# Patient Record
Sex: Female | Born: 1985 | Race: White | Hispanic: No | Marital: Single | Smoking: Never smoker
Health system: Southern US, Community
[De-identification: ages and names within clinical notes are randomized; demographics above are authoritative.]

## PROBLEM LIST (undated history)

## (undated) DIAGNOSIS — K219 Gastro-esophageal reflux disease without esophagitis: Secondary | ICD-10-CM

## (undated) DIAGNOSIS — M0800 Unspecified juvenile rheumatoid arthritis of unspecified site: Secondary | ICD-10-CM

## (undated) DIAGNOSIS — R61 Generalized hyperhidrosis: Secondary | ICD-10-CM

## (undated) DIAGNOSIS — D649 Anemia, unspecified: Secondary | ICD-10-CM

## (undated) DIAGNOSIS — M419 Scoliosis, unspecified: Secondary | ICD-10-CM

## (undated) DIAGNOSIS — R51 Headache: Secondary | ICD-10-CM

## (undated) DIAGNOSIS — L905 Scar conditions and fibrosis of skin: Secondary | ICD-10-CM

## (undated) DIAGNOSIS — K589 Irritable bowel syndrome without diarrhea: Secondary | ICD-10-CM

## (undated) DIAGNOSIS — M1990 Unspecified osteoarthritis, unspecified site: Secondary | ICD-10-CM

## (undated) DIAGNOSIS — F329 Major depressive disorder, single episode, unspecified: Secondary | ICD-10-CM

## (undated) DIAGNOSIS — F32A Depression, unspecified: Secondary | ICD-10-CM

## (undated) HISTORY — PX: OTHER SURGICAL HISTORY: SHX169

## (undated) HISTORY — PX: TOTAL HIP ARTHROPLASTY: SHX124

---
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
## 2006-05-20 ENCOUNTER — Ambulatory Visit: Payer: Self-pay | Admitting: Family Medicine

## 2006-11-23 ENCOUNTER — Encounter: Admission: RE | Admit: 2006-11-23 | Discharge: 2006-11-23 | Payer: Self-pay | Admitting: Sports Medicine

## 2006-11-23 ENCOUNTER — Ambulatory Visit: Payer: Self-pay | Admitting: Family Medicine

## 2006-11-24 ENCOUNTER — Ambulatory Visit: Payer: Self-pay | Admitting: Family Medicine

## 2006-11-28 ENCOUNTER — Ambulatory Visit: Payer: Self-pay | Admitting: Sports Medicine

## 2006-11-29 ENCOUNTER — Ambulatory Visit: Payer: Self-pay | Admitting: Family Medicine

## 2006-12-22 DIAGNOSIS — L708 Other acne: Secondary | ICD-10-CM

## 2007-01-13 ENCOUNTER — Telehealth (INDEPENDENT_AMBULATORY_CARE_PROVIDER_SITE_OTHER): Payer: Self-pay | Admitting: *Deleted

## 2007-01-25 ENCOUNTER — Ambulatory Visit: Payer: Self-pay | Admitting: Family Medicine

## 2007-01-26 ENCOUNTER — Telehealth (INDEPENDENT_AMBULATORY_CARE_PROVIDER_SITE_OTHER): Payer: Self-pay | Admitting: *Deleted

## 2007-01-26 ENCOUNTER — Telehealth (INDEPENDENT_AMBULATORY_CARE_PROVIDER_SITE_OTHER): Payer: Self-pay | Admitting: Family Medicine

## 2007-01-31 ENCOUNTER — Ambulatory Visit: Payer: Self-pay | Admitting: Family Medicine

## 2007-02-28 ENCOUNTER — Telehealth (INDEPENDENT_AMBULATORY_CARE_PROVIDER_SITE_OTHER): Payer: Self-pay | Admitting: *Deleted

## 2007-03-06 ENCOUNTER — Telehealth (INDEPENDENT_AMBULATORY_CARE_PROVIDER_SITE_OTHER): Payer: Self-pay | Admitting: Family Medicine

## 2008-01-03 ENCOUNTER — Encounter (INDEPENDENT_AMBULATORY_CARE_PROVIDER_SITE_OTHER): Payer: Self-pay | Admitting: Family Medicine

## 2008-01-31 ENCOUNTER — Ambulatory Visit: Payer: Self-pay | Admitting: Family Medicine

## 2008-01-31 DIAGNOSIS — F329 Major depressive disorder, single episode, unspecified: Secondary | ICD-10-CM

## 2008-05-17 ENCOUNTER — Telehealth: Payer: Self-pay | Admitting: *Deleted

## 2008-05-20 ENCOUNTER — Ambulatory Visit: Payer: Self-pay | Admitting: Family Medicine

## 2008-06-11 ENCOUNTER — Ambulatory Visit: Payer: Self-pay | Admitting: Family Medicine

## 2008-06-11 LAB — CONVERTED CEMR LAB: Beta hcg, urine, semiquantitative: NEGATIVE

## 2008-06-12 ENCOUNTER — Ambulatory Visit: Payer: Self-pay | Admitting: Family Medicine

## 2008-06-17 ENCOUNTER — Telehealth: Payer: Self-pay | Admitting: *Deleted

## 2008-06-18 ENCOUNTER — Ambulatory Visit: Payer: Self-pay | Admitting: Family Medicine

## 2008-07-08 ENCOUNTER — Telehealth: Payer: Self-pay | Admitting: *Deleted

## 2008-07-30 ENCOUNTER — Encounter (INDEPENDENT_AMBULATORY_CARE_PROVIDER_SITE_OTHER): Payer: Self-pay | Admitting: Family Medicine

## 2008-07-31 ENCOUNTER — Encounter (INDEPENDENT_AMBULATORY_CARE_PROVIDER_SITE_OTHER): Payer: Self-pay | Admitting: Family Medicine

## 2008-09-10 ENCOUNTER — Ambulatory Visit: Payer: Self-pay | Admitting: Family Medicine

## 2008-12-09 ENCOUNTER — Ambulatory Visit: Payer: Self-pay | Admitting: Family Medicine

## 2008-12-09 ENCOUNTER — Telehealth (INDEPENDENT_AMBULATORY_CARE_PROVIDER_SITE_OTHER): Payer: Self-pay | Admitting: Family Medicine

## 2008-12-10 ENCOUNTER — Telehealth: Payer: Self-pay | Admitting: Family Medicine

## 2009-02-03 ENCOUNTER — Telehealth (INDEPENDENT_AMBULATORY_CARE_PROVIDER_SITE_OTHER): Payer: Self-pay | Admitting: *Deleted

## 2009-02-04 ENCOUNTER — Encounter: Payer: Self-pay | Admitting: Family Medicine

## 2009-02-04 ENCOUNTER — Ambulatory Visit: Payer: Self-pay | Admitting: Family Medicine

## 2009-02-05 LAB — CONVERTED CEMR LAB: CRP, High Sensitivity: 10.4 — ABNORMAL HIGH

## 2009-02-24 ENCOUNTER — Ambulatory Visit: Payer: Self-pay | Admitting: Family Medicine

## 2009-02-24 DIAGNOSIS — M069 Rheumatoid arthritis, unspecified: Secondary | ICD-10-CM | POA: Insufficient documentation

## 2009-03-03 ENCOUNTER — Telehealth (INDEPENDENT_AMBULATORY_CARE_PROVIDER_SITE_OTHER): Payer: Self-pay | Admitting: Family Medicine

## 2009-04-01 ENCOUNTER — Telehealth: Payer: Self-pay | Admitting: *Deleted

## 2009-04-22 ENCOUNTER — Encounter (INDEPENDENT_AMBULATORY_CARE_PROVIDER_SITE_OTHER): Payer: Self-pay | Admitting: Family Medicine

## 2009-04-24 ENCOUNTER — Telehealth: Payer: Self-pay | Admitting: Family Medicine

## 2009-05-08 ENCOUNTER — Telehealth: Payer: Self-pay | Admitting: *Deleted

## 2009-05-08 ENCOUNTER — Ambulatory Visit: Payer: Self-pay | Admitting: Family Medicine

## 2009-05-08 LAB — CONVERTED CEMR LAB
Glucose, Urine, Semiquant: NEGATIVE
Protein, U semiquant: 100
RBC / HPF: >20
Specific Gravity, Urine: >=1.03
Urobilinogen, UA: 1
pH: 6

## 2009-05-12 ENCOUNTER — Telehealth: Payer: Self-pay | Admitting: Family Medicine

## 2009-05-12 ENCOUNTER — Emergency Department (HOSPITAL_COMMUNITY): Admission: EM | Admit: 2009-05-12 | Discharge: 2009-05-13 | Payer: Self-pay | Admitting: Emergency Medicine

## 2009-05-12 ENCOUNTER — Ambulatory Visit: Payer: Self-pay | Admitting: Family Medicine

## 2009-05-13 ENCOUNTER — Ambulatory Visit (HOSPITAL_COMMUNITY): Admission: RE | Admit: 2009-05-13 | Discharge: 2009-05-13 | Payer: Self-pay | Admitting: Emergency Medicine

## 2009-05-15 ENCOUNTER — Telehealth: Payer: Self-pay | Admitting: Family Medicine

## 2009-05-16 ENCOUNTER — Emergency Department (HOSPITAL_COMMUNITY): Admission: EM | Admit: 2009-05-16 | Discharge: 2009-05-16 | Payer: Self-pay | Admitting: Emergency Medicine

## 2009-05-17 ENCOUNTER — Emergency Department (HOSPITAL_COMMUNITY): Admission: EM | Admit: 2009-05-17 | Discharge: 2009-05-17 | Payer: Self-pay | Admitting: Emergency Medicine

## 2009-05-20 ENCOUNTER — Telehealth: Payer: Self-pay | Admitting: Family Medicine

## 2009-06-10 ENCOUNTER — Encounter: Payer: Self-pay | Admitting: Family Medicine

## 2009-06-11 ENCOUNTER — Encounter: Payer: Self-pay | Admitting: Family Medicine

## 2009-06-22 ENCOUNTER — Emergency Department (HOSPITAL_COMMUNITY): Admission: EM | Admit: 2009-06-22 | Discharge: 2009-06-22 | Payer: Self-pay | Admitting: Emergency Medicine

## 2009-10-03 ENCOUNTER — Emergency Department (HOSPITAL_COMMUNITY): Admission: EM | Admit: 2009-10-03 | Discharge: 2009-10-03 | Payer: Self-pay | Admitting: Emergency Medicine

## 2009-10-13 ENCOUNTER — Observation Stay (HOSPITAL_COMMUNITY): Admission: EM | Admit: 2009-10-13 | Discharge: 2009-10-13 | Payer: Self-pay | Admitting: Emergency Medicine

## 2009-10-25 ENCOUNTER — Observation Stay (HOSPITAL_COMMUNITY): Admission: EM | Admit: 2009-10-25 | Discharge: 2009-10-25 | Payer: Self-pay | Admitting: Emergency Medicine

## 2009-12-03 ENCOUNTER — Observation Stay (HOSPITAL_COMMUNITY): Admission: EM | Admit: 2009-12-03 | Discharge: 2009-12-03 | Payer: Self-pay | Admitting: Emergency Medicine

## 2009-12-10 ENCOUNTER — Emergency Department (HOSPITAL_COMMUNITY): Admission: EM | Admit: 2009-12-10 | Discharge: 2009-12-11 | Payer: Self-pay | Admitting: Emergency Medicine

## 2009-12-15 ENCOUNTER — Encounter (INDEPENDENT_AMBULATORY_CARE_PROVIDER_SITE_OTHER): Payer: Self-pay | Admitting: *Deleted

## 2009-12-23 ENCOUNTER — Inpatient Hospital Stay (HOSPITAL_COMMUNITY): Admission: EM | Admit: 2009-12-23 | Discharge: 2009-12-26 | Payer: Self-pay | Admitting: Emergency Medicine

## 2010-01-21 ENCOUNTER — Encounter: Payer: Self-pay | Admitting: Gastroenterology

## 2010-01-22 ENCOUNTER — Ambulatory Visit: Payer: Self-pay | Admitting: Gastroenterology

## 2010-01-22 DIAGNOSIS — K589 Irritable bowel syndrome without diarrhea: Secondary | ICD-10-CM | POA: Insufficient documentation

## 2010-01-22 LAB — CONVERTED CEMR LAB
Alkaline Phosphatase: 68 units/L (ref 39–117)
BUN: 6 mg/dL (ref 6–23)
Basophils Relative: 0.2 % (ref 0.0–3.0)
Calcium: 9 mg/dL (ref 8.4–10.5)
Chloride: 103 meq/L (ref 96–112)
Creatinine, Ser: 0.5 mg/dL (ref 0.4–1.2)
Eosinophils Absolute: 0 10*3/uL (ref 0.0–0.7)
Eosinophils Relative: 0.2 % (ref 0.0–5.0)
Ferritin: 23.5 ng/mL (ref 10.0–291.0)
Glucose, Bld: 78 mg/dL (ref 70–99)
Hemoglobin: 11.1 g/dL — ABNORMAL LOW (ref 12.0–15.0)
Iron: 53 ug/dL (ref 42–145)
Lymphocytes Relative: 5.6 % — ABNORMAL LOW (ref 12.0–46.0)
Lymphs Abs: 0.7 10*3/uL (ref 0.7–4.0)
MCHC: 30.7 g/dL (ref 30.0–36.0)
Magnesium: 2 mg/dL (ref 1.5–2.5)
Monocytes Absolute: 0.9 10*3/uL (ref 0.1–1.0)
Platelets: 456 10*3/uL — ABNORMAL HIGH (ref 150.0–400.0)
RDW: 25.3 % — ABNORMAL HIGH (ref 11.5–14.6)
Sodium: 139 meq/L (ref 135–145)
Total Bilirubin: 0.6 mg/dL (ref 0.3–1.2)
Total Protein: 7.1 g/dL (ref 6.0–8.3)
Transferrin: 257.4 mg/dL (ref 212.0–360.0)
Vitamin B-12: 210 pg/mL — ABNORMAL LOW (ref 211–911)

## 2010-01-23 ENCOUNTER — Telehealth: Payer: Self-pay | Admitting: Gastroenterology

## 2010-01-27 ENCOUNTER — Ambulatory Visit (HOSPITAL_COMMUNITY): Admission: RE | Admit: 2010-01-27 | Discharge: 2010-01-27 | Payer: Self-pay | Admitting: Gastroenterology

## 2010-02-02 ENCOUNTER — Ambulatory Visit: Payer: Self-pay | Admitting: Gastroenterology

## 2010-02-04 ENCOUNTER — Encounter: Payer: Self-pay | Admitting: Gastroenterology

## 2010-02-25 ENCOUNTER — Emergency Department (HOSPITAL_COMMUNITY): Admission: EM | Admit: 2010-02-25 | Discharge: 2010-02-25 | Payer: Self-pay | Admitting: Emergency Medicine

## 2010-03-03 ENCOUNTER — Encounter: Admission: RE | Admit: 2010-03-03 | Discharge: 2010-03-03 | Payer: Self-pay | Admitting: Internal Medicine

## 2010-09-11 ENCOUNTER — Inpatient Hospital Stay (HOSPITAL_COMMUNITY)
Admission: EM | Admit: 2010-09-11 | Discharge: 2010-09-13 | Payer: Self-pay | Source: Home / Self Care | Admitting: Emergency Medicine

## 2010-10-26 ENCOUNTER — Encounter: Payer: Self-pay | Admitting: Family Medicine

## 2010-11-24 NOTE — Letter (Signed)
Summary: New Patient letter  [facility]  [address] [place], [place] [address]   Phone: [phone]  Fax: [phone]       12/15/2009 MRN: [id number]  [facility] [address] [place], [place]  [address]  Dear Ms. Schafer,  Welcome to the Gastroenterology Division at [facility].    You are scheduled to see Dr.  Jarold Motto on 12-25-09 at 9:30a.m. on the 3rd floor at [facility], [address]  We ask that you try to arrive at our office 15 minutes prior to your appointment time to allow for check-in.  We would like you to complete the enclosed self-administered evaluation form prior to your visit and bring it with you on the day of your appointment.  We will review it with you.  Also, please bring a complete list of all your medications or, if you prefer, bring the medication bottles and we will list them.  Please bring your insurance card so that we may make a copy of it.  If your insurance requires a referral to see a specialist, please bring your referral form from your primary care physician.  Co-payments are due at the time of your visit and may be paid by cash, check or credit card.     Your office visit will consist of a consult with your physician (includes a physical exam), any laboratory testing he/she may order, scheduling of any necessary diagnostic testing (e.g. x-ray, ultrasound, CT-scan), and scheduling of a procedure (e.g. Endoscopy, Colonoscopy) if required.  Please allow enough time on your schedule to allow for any/all of these possibilities.    If you cannot keep your appointment, please call [phone] to cancel or reschedule prior to your appointment date.  This allows [place] the opportunity to schedule an appointment for another patient in need of care.  If you do not cancel or reschedule by 5 p.m. the business day prior to your appointment date, you will be charged a $50.00 late cancellation/no-show fee.    Thank you for  choosing [facility] for your medical needs.  We appreciate the opportunity to care for you.  Please visit [place] at our website  to learn more about our practice.                     Sincerely,                                                             The Gastroenterology Division

## 2010-11-24 NOTE — Progress Notes (Signed)
Summary: rx sent to wrong pharmacy  Phone Note Call from Patient Call back at Home Phone [phone]   Caller: mom Corrie Dandy Call For: Ha Placeres Reason for Call: Talk to Nurse Summary of Call: Patient states that rx was sent to the wrong pharmacy please resend them to [facility] on [address] Initial call taken by: Tawni Levy,  January 23, 2010 12:48 PM  Follow-up for Phone Call        Rxs sent to [facility]. Follow-up by: Ashok Cordia RN,  January 23, 2010 1:45 PM  Additional Follow-up for Phone Call Additional follow up Details #1::        Per pt's mother, pt does not need the movi prep sent to [facility].  Rx for the movi prep cancelled. Additional Follow-up by: Ashok Cordia RN,  January 23, 2010 2:07 PM    Prescriptions: GI COCKTAIL, EQUAL PARTS MAALOX, DONNATAL, XYLOCAINE 1 tbsp q 6-8 hrs as needed  #14 oz x 2   Entered by:   Ashok Cordia RN   Authorized by:   Mardella Layman MD [facility]   Signed by:   Ashok Cordia RN on 01/23/2010   Method used:   Printed then faxed to ...       [facility] [address] [phone]* (retail)       [address]       [place], [place]  [address]       Ph: [phone]       Fax: [phone]   RxID:   [id number] MOVIPREP 100 GM  SOLR (PEG-KCL-NACL-NASULF-NA ASC-C) As per prep instructions.  #1 x 2   Entered by:   Ashok Cordia RN   Authorized by:   Mardella Layman MD [facility]   Signed by:   Ashok Cordia RN on 01/23/2010   Method used:   Electronically to        [facility] [phone]* (retail)       [address]       [place], [place]  [address]       Ph: [phone]       Fax: [phone]   RxID:   [id number]

## 2010-11-24 NOTE — Procedures (Signed)
Summary: Colonoscopy  Patient: Kaili Castille Note: All result statuses are Final unless otherwise noted.  Tests: (1) Colonoscopy (COL)   COL Colonoscopy           DONE     [facility]     [address]     [place], [place]  [address]           COLONOSCOPY PROCEDURE REPORT           PATIENT:  Susan, Bleich  MR#:  [id number]     BIRTHDATE:  Mar 18, 1986, 23 yrs. old  GENDER:  female     ENDOSCOPIST:  Vania Rea. Jarold Motto, MD, [facility]     REF. BY:     PROCEDURE DATE:  02/02/2010     PROCEDURE:  Diagnostic Colonoscopy     ASA CLASS:  Class II     INDICATIONS:  abdominal pain, change in bowel habits     MEDICATIONS:   Fentanyl 100 mcg IV, Versed 10           DESCRIPTION OF PROCEDURE:   After the risks benefits and     alternatives of the procedure were thoroughly explained, informed     consent was obtained.  Digital rectal exam was performed and     revealed no abnormalities.   The LB [id number]-[id number] endoscope     was introduced through the anus and advanced to the cecum, which     was identified by both the appendix and ileocecal valve, limited     by a redundant colon, poor preparation.  EXTREMELY REDUNDANT AND     TORTUOUS COLON.  The quality of the prep was poor, using Nulytley.     The instrument was then slowly withdrawn as the colon was fully     examined.     <<PROCEDUREIMAGES>>           FINDINGS:  No polyps or cancers were seen.  This was otherwise a     normal examination of the colon.   Retroflexed views in the rectum     revealed no abnormalities.    The scope was then withdrawn from     the patient and the procedure completed.           COMPLICATIONS:  None     ENDOSCOPIC IMPRESSION:     1) No polyps or cancers     2) Otherwise normal examination     SEVERE IBS.     RECOMMENDATIONS:     1) Upper Endoscopy     2) Continue current medications     REPEAT EXAM:  No           ______________________________     Vania Rea. Jarold Motto, MD, [facility]           CC:   [name], MD           n.     Rosalie DoctorMarland Kitchen   Vania Rea. Loran Fleet at 02/02/2010 03:57 PM           Nonda Lou, [id number]  Note: An exclamation mark (!) indicates a result that was not dispersed into the flowsheet. Document Creation Date: 02/02/2010 3:58 PM _______________________________________________________________________  (1) Order result status: Final Collection or observation date-time: 02/02/2010 15:52 Requested date-time:  Receipt date-time:  Reported date-time:  Referring Physician:   Ordering Physician: [name] [phone]) Specimen Source:  Source: Launa Grill Order Number: [id number] Lab site:

## 2010-11-24 NOTE — Letter (Signed)
Summary: Patient [facility] Biopsy Results  [facility]  [address] [place], [place] [address]   Phone: [phone]  Fax: [phone]        February 04, 2010 MRN: [id number]    [facility] [address] [place], [place]  [address]    Dear Ms. Kneisley,  I am pleased to inform you that the biopsies taken during your recent endoscopic examination did not show any evidence of cancer upon pathologic examination.No celiac disease noted.... Additional information/recommendations:  __No further action is needed at this time.  Please follow-up with      your primary care physician for your other healthcare needs.  __ Please call [phone] to schedule a return visit to review      your condition.  XX__ Continue with the treatment plan as outlined on the day of your      exam.  __ You should have a repeat endoscopic examination for this problem              in _ months/years.   Please call us if you are having persistent problems or have questions about your condition that have not been fully answered at this time.  Sincerely,  Mardella Layman MD [facility]  This letter has been electronically signed by your physician.  Appended Document: Patient Notice-Endo Biopsy Results letter mailed 4.15.11

## 2010-11-24 NOTE — Assessment & Plan Note (Signed)
Summary: abdominal pain--ch.   History of Present Illness Visit Type: Initial Consult Primary GI MD: [name] MD FACP FAGA Primary Provider: [name], MD Requesting Provider: [name], MD Chief Complaint: Increase in acid reflux and having bowel habit changes. [name] [name] states her bowels go from constipation to diarrhea, no blood in her stool.  History of Present Illness:   25 year old white female referred for recurrent nausea and vomiting, crampy abdominal pain, alternating diarrhea and constipation, general malaise, headaches, and recurrent dehydration.  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 This patient has juvenile rheumatoid arthritis is currently on tapering doses of prednisone and also plans  to begin Enbrel per her rheumatologist [name].She was hospitalized on March 1 with acute dehydration although we do not have this discharge summary. She has had acid reflux symptoms which have responded to PPI therapy but currently is not on these medications. She gives a long history of alternating diarrhea and constipation, periodic nausea and vomiting with diffuse abdominal pain, but no specific hepatobiliary complaints.  She has normal menstrual periods and  denies menorrhagia. She has been on iron replacement therapy because of nonspecific iron deficiency. She also has a history of chronic anxiety and depression is on Celexa 10 mg a day, p.r.n. tramadol, and gabapentin 300 mg 4 tablets q.i.d. for suspected peripheral neuropathy. The patient denies excessive use of NSAIDs or history of previous peptic ulcer disease, gallbladder disease, pancreatitis or hepatitis. Her appetite remains good and her weight is stable. Family history is noncontributory.   GI Review of Systems    Reports abdominal pain, acid reflux, and  nausea.     Location of  Abdominal pain: epigastric area.    Denies  belching, bloating, chest pain, dysphagia with liquids, dysphagia with solids, heartburn, loss of appetite, vomiting, vomiting blood, weight loss, and  weight gain.      Reports change in bowel habits, constipation, and  diarrhea.     Denies anal fissure, black tarry stools, diverticulosis, fecal incontinence, heme positive stool, hemorrhoids, irritable bowel syndrome, jaundice, light color stool, liver problems, rectal bleeding, and  rectal pain.    Current Medications (verified): 1)  Prednisone 10 Mg Tabs (Prednisone) .... 2 Tablets By Mouth Once Daily 2)  Tramadol Hcl 50 Mg Tabs (Tramadol Hcl) .... One Tablet By Mouth As Needed 3)  Celexa 10 Mg Tabs (Citalopram Hydrobromide) .... One Tablet By Mouth At Bedtime 4)  Fergon 240 (27 Fe) Mg Tabs (Ferrous Gluconate) .... 2 Tablets By Mouth Three Times A Day 5)  Gabapentin 300 Mg Caps (Gabapentin) .... 4 Tablets By Mouth Four Times A Day  Allergies (verified): No Known Drug Allergies  Past History:  Past medical, surgical, family and social histories (including risk factors) reviewed for relevance to current acute and chronic problems.  Past Medical History: Reviewed history from 07/31/2008 and no changes required. Elevated Lupus Ant-Coag panel and Anti-coag+ ESR 125 on 05/22/04 neg ANA/RF 05/2004 seronegative rheumatoid arthritis, followed by Dr. Denyce Robert at [facility]  Past Surgical History: Reviewed history from 02/24/2009 and no changes required. HLA B-27 negative - 07/07/2004 Knee Aspiration May 2005 - 07/07/2004 wrist and knee x-rays negative - 05/25/2004  Family History: Reviewed history from 01/25/2007 and no changes required. Fathers history unknown, Grandmother may have had lupus and had breast CA, Mother healthy - benign breast lumps No other known rheumatological dz  Social History: Reviewed history from 05/12/2009 and no  changes required. Unable to work due to joint pain.  Does not have drivers license. VERY sheltered life.   Older brother lives in [place].  No tob, ETOH, drugs.  Denies sexual activity.  Lives with mother who is currently not working.  Has h/o older female family friend stalking her.  -- Unable to afford the Rheum doc in [place] due to cost--  Review of Systems       The patient complains of anemia, arthritis/joint pain, fatigue, and headaches-new.  The patient denies allergy/sinus, anxiety-new, back pain, blood in urine, breast changes/lumps, change in vision, confusion, cough, coughing up blood, depression-new, fainting, fever, hearing problems, heart murmur, heart rhythm changes, itching, menstrual pain, muscle pains/cramps, night sweats, nosebleeds, pregnancy symptoms, shortness of breath, skin rash, sleeping problems, sore throat, swelling of feet/legs, swollen lymph glands, thirst - excessive , urination - excessive , urination changes/pain, urine leakage, vision changes, and voice change.    Vital Signs:  Patient profile:   25 year old female Height:      63 inches Weight:      107.25 pounds BMI:     19.07 Pulse rate:   100 / minute Pulse rhythm:   regular BP sitting:   94 / 54  (left arm) Cuff size:   regular  Vitals Entered By: Christie Nottingham CMA [name]) (January 22, 2010 9:38 AM)  Physical Exam  General:  Well developed, well nourished, no acute distress.healthy appearing.   Head:  Normocephalic and atraumatic. Eyes:  PERRLA, no icterus.exam deferred to patient's ophthalmologist.   Neck:  Supple; no masses or thyromegaly. Lungs:  Clear throughout to auscultation. Heart:  Regular rate and rhythm; no murmurs, rubs,  or bruits. Abdomen:  Soft, nontender and nondistended. No masses, hepatosplenomegaly or hernias noted. Normal bowel sounds. Rectal:  deferred until time of colonoscopy.   Msk:  There a rheumatoid nodules over her knees and wrist areas. She has some ulnar deformity of her hands and swelling of her MCP joints. Pulses:  Normal pulses noted. Neurologic:  Alert and  oriented x4;   grossly normal neurologically. Skin:  Intact without significant lesions or rashes. Cervical Nodes:  No significant cervical adenopathy. Inguinal Nodes:  No significant inguinal adenopathy. Psych:  Alert and cooperative. Normal mood and affect.   Impression & Recommendations:  Problem # 1:  NAUSEA WITH VOMITING (ICD-787.01) Assessment Improved Probable hyperacidity with acid reflux-rule out cholelithiasis, recurrent pancreatitis, or GI motility disturbance. Labs have been ordered along with endoscopic exam and upper abdominal ultrasound examination. I placed her on Dexilant 60 mg a day along with standard antireflux maneuvers.She also will need examination for H. pylori  and has small bowel biopsy to exclude celiac disease Orders: TLB-CBC Platelet - w/Differential (85025-CBCD) TLB-BMP (Basic Metabolic Panel-BMET) (80048-METABOL) TLB-Hepatic/Liver Function Pnl (80076-HEPATIC) TLB-TSH (Thyroid Stimulating Hormone) (84443-TSH) TLB-B12, Serum-Total ONLY ([address]-U04) TLB-Ferritin (82728-FER) TLB-Folic Acid (Folate) (82746-FOL) TLB-IBC Pnl (Iron/FE;Transferrin) (83550-IBC) TLB-Amylase (82150-AMYL) TLB-H. Pylori Abs(Helicobacter Pylori) (86677-HELICO) TLB-Lipase (83690-LIPASE) TLB-Magnesium (Mg) (83735-MG) T-Beta Carotene [phone]) T-Giardia Lamblia IFA [phone]) T-Sprue Panel (Celiac Disease Aby Eval) (83516x3/[address]) TLB-IgA (Immunoglobulin A) (82784-IGA) T-Vitamin D (25-Hydroxy) (86578-46962) T- * Misc. Laboratory test [phone]) Colon/Endo (Colon/Endo)  Problem # 2:  IBS (ICD-564.1) Assessment: Improved Colonoscopy to complete her workup.Consider further evaluation of her GI motility disorder and possible bacterial overgrowth syndrome.Colonoscopy Exam indicated per history of unexplained iron deficiency although I suspect this is from menstrual blood loss were chronic low-grade malabsorption. Orders: TLB-CBC Platelet - w/Differential (85025-CBCD) TLB-BMP (Basic Metabolic  Panel-BMET) (80048-METABOL) TLB-Hepatic/Liver Function  Pnl (80076-HEPATIC) TLB-TSH (Thyroid Stimulating Hormone) (84443-TSH) TLB-B12, Serum-Total ONLY ([address]-U04) TLB-Ferritin (82728-FER) TLB-Folic Acid (Folate) (82746-FOL) TLB-IBC Pnl (Iron/FE;Transferrin) (83550-IBC) TLB-Amylase (82150-AMYL) TLB-H. Pylori Abs(Helicobacter Pylori) (86677-HELICO) TLB-Lipase (83690-LIPASE) TLB-Magnesium (Mg) (83735-MG) T-Beta Carotene [phone]) T-Giardia Lamblia IFA [phone]) T-Sprue Panel (Celiac Disease Aby Eval) (83516x3/[address]) TLB-IgA (Immunoglobulin A) (82784-IGA) T-Vitamin D (25-Hydroxy) (86578-46962) T- * Misc. Laboratory test [phone]) Colon/Endo (Colon/Endo)  Problem # 3:  ARTHRITIS, RHEUMATOID (ICD-714.0) Assessment: Improved Continue prednisone taper, and I agree that Enbrel therapy indicated for her rheumatoid disease. She has appointment to followup with [name] in rheumatology.  Other Orders: Ultrasound Abdomen (UAS)  Patient Instructions: 1)  Please go to the basement for lab work. 2)  Begin Dexilant once daily. 3)  Lactose Free Diet handout given.  4)  You will be scheduled for an endoscopy and colonoscopy. 5)  You will be scheduled for an ultrasound. 6)  Begin Gi cocktail as needed for abd pain,n and v. 7)  The medication list was reviewed and reconciled.  All changed / newly prescribed medications were explained.  A complete medication list was provided to the patient / caregiver. 8)  Avoid foods high in acid content ( tomatoes, citrus juices, spicy foods) . Avoid eating within 3 to 4 hours of lying down or before exercising. Do not over eat; try smaller more frequent meals. Elevate head of bed four inches when sleeping.  9)  Conscious Sedation brochure given.  10)  Copy sent to : Dr. [name] and [name] 11)  Please continue current medications.  12)  Consider gastric emptying scan and other motility studies. Prescriptions: GI COCKTAIL,  EQUAL PARTS MAALOX, DONNATAL, XYLOCAINE 1 tbsp q 6-8 hrs as needed  #1 [name] [name] x 1   Entered by:   Ashok Cordia RN   Authorized by:   Mardella Layman MD [facility]   Signed by:   Ashok Cordia RN on 01/22/2010   Method used:   Faxed to ...       [facility]  [address] [id number] (retail)       [address]       [place], [place]  [address]       Ph: [phone]       Fax: [phone]   RxID:   [id number] MOVIPREP 100 GM  SOLR (PEG-KCL-NACL-NASULF-NA ASC-C) As per prep instructions.  #1 x 0   Entered by:   Ashok Cordia RN   Authorized by:   Mardella Layman MD [facility]   Signed by:   Ashok Cordia RN on 01/22/2010   Method used:   Electronically [facility]        [facility]  [address] [phone]* (retail)       [address]       [place], [place]  [address]       Ph: [phone]       Fax: [phone]   RxID:   [id number]

## 2010-11-24 NOTE — Procedures (Signed)
Summary: Upper Endoscopy  Patient: [name] [name] Note: All result statuses are Final unless otherwise noted.  Tests: (1) Upper Endoscopy (EGD)   EGD Upper Endoscopy       DONE     [facility]     [address]     [place], [place]  [address]           ENDOSCOPY PROCEDURE REPORT           PATIENT:  [name], [name]  MR#:  [id number]     BIRTHDATE:  03-29-1986, 23 yrs. old  GENDER:  female           ENDOSCOPIST:  Vania Rea. Jarold Motto, MD, [facility]     Referred by:           PROCEDURE DATE:  02/02/2010     PROCEDURE:  EGD with biopsy     ASA CLASS:  Class II     INDICATIONS:  nausea and vomiting, abdominal pain, iron deficiency     anemia           MEDICATIONS:   There was residual sedation effect present from     prior procedure.     TOPICAL ANESTHETIC:           DESCRIPTION OF PROCEDURE:   After the risks benefits and     alternatives of the procedure were thoroughly explained, informed     consent was obtained.  The [facility] [id number] [id number] endoscope was     introduced through the mouth and advanced to the second portion of     the duodenum, without limitations.  The instrument was slowly     withdrawn as the mucosa was fully examined.     <<PROCEDUREIMAGES>>           Normal GE junction was noted.  Normal duodenal folds were noted.     DUODENAL BIOPSIES DONE.R/O CELIAC DISEASE.  The esophagus and     gastroesophageal junction were completely normal in appearance.     The stomach was entered and closely examined. The antrum,     angularis, and lesser curvature were well visualized, including a     retroflexed view of the cardia and fundus. The stomach wall was     normally distensable. The scope passed easily through the pylorus     into the duodenum. CLO BX. OF ANTRUM DONE.NO RETAINED FOOD OR BILE     IN STOMACH.    Retroflexed views revealed no abnormalities.    The     scope was then withdrawn from the patient and the procedure     completed.           COMPLICATIONS:   None           ENDOSCOPIC IMPRESSION:     1) Normal GE junction     2) Normal duodenal folds     3) Normal esophagus     4) Normal stomach     NORMAL EXAM.R/O CELIAC DISEASE,R/O H.PYLORI.     RECOMMENDATIONS:     ULTRASOUND EXAM AND LABS OTHERWISE ARE NORMAL.STOOL GIARDIA EXAM     PENDING.SERUM ANTIBODIES C/W PRIOR GIARDIASIS.           REPEAT EXAM:  No           ______________________________     Vania Rea. Jarold Motto, MD, [facility] Graham           CC:  [name], MD  n.     eSIGNED:   Vania Rea. Laneya Gasaway at 02/02/2010 04:08 PM           Nonda Lou, [id number]  Note: An exclamation mark (!) indicates a result that was not dispersed into the flowsheet. Document Creation Date: 02/02/2010 4:09 PM _______________________________________________________________________  (1) Order result status: Final Collection or observation date-time: 02/02/2010 16:02 Requested date-time:  Receipt date-time:  Reported date-time:  Referring Physician:   Ordering Physician: [name] [phone]) Specimen Source:  Source: Launa Grill Order Number: [id number] Lab site:

## 2010-11-24 NOTE — Miscellaneous (Signed)
Summary: Orders Update Clotest  Clinical Lists Changes  Orders: Added new Test order of TLB-H Pylori Screen Gastric Biopsy (83013-CLOTEST) - Signed 

## 2010-11-26 NOTE — Miscellaneous (Signed)
Summary: PROBLEM LIST UPDATE   Clinical Lists Changes  Problems: Removed problem of NAUSEA WITH VOMITING (ICD-787.01) Removed problem of ABDOMINAL PAIN, GENERALIZED (ICD-789.07) Removed problem of URINARY FREQUENCY (ICD-788.41) Removed problem of COST CONCERNS (ICD-616.3) Removed problem of PAIN IN JOINT, MULTIPLE SITES (ICD-719.49)

## 2010-12-02 ENCOUNTER — Emergency Department (HOSPITAL_COMMUNITY)
Admission: EM | Admit: 2010-12-02 | Discharge: 2010-12-02 | Disposition: A | Discharge status: Home / Self Care | Payer: Medicaid Other | Attending: Emergency Medicine | Admitting: Emergency Medicine

## 2010-12-02 DIAGNOSIS — J392 Other diseases of pharynx: Secondary | ICD-10-CM | POA: Insufficient documentation

## 2010-12-02 DIAGNOSIS — J3489 Other specified disorders of nose and nasal sinuses: Secondary | ICD-10-CM | POA: Insufficient documentation

## 2010-12-02 DIAGNOSIS — M069 Rheumatoid arthritis, unspecified: Secondary | ICD-10-CM | POA: Insufficient documentation

## 2010-12-15 NOTE — H&P (Signed)
NAME:  [name] [name], [name] [name]                 ACCOUNT NO.:  [id number]  MEDICAL RECORD NO.:  [id number]          PATIENT TYPE:  INP  LOCATION:  1524                         FACILITY:  [facility]  PHYSICIAN:  Lucile Crater, MD         DATE OF BIRTH:  05/28/1986  DATE OF ADMISSION:  09/11/2010 DATE OF DISCHARGE:                             HISTORY & PHYSICAL   PRIMARY CARE PHYSICIAN:  [name], M.D.  CHIEF COMPLAINT:  Nausea and vomiting.  HISTORY OF PRESENT ILLNESS:  The patient is a 25 year old female with a history of major depressive disorder, chronic pain, and rheumatoid arthritis.  The patient does not provide much of the history.  She is lying in bed in tears all the time and does not respond to questions appropriately.  She does not give the history.  The patient's mom states that the patient is currently disoriented because she does not have any IV fluids running at this time.  She states that the patient is coherent and cognitive only when she has some kind of fluids running in her all the time.  The patient received 4 L of fluid so far and the family requests to get more fluids and they state that the last time the patient was in the hospital the same thing happened, the fluids were stopped for more than 15 minutes, the patient got disoriented and confused.  Mom states that when this happens, the patient cannot even speak, but she actually does put the code in which the patient speaks and she is only one who can understand it.  Reportedly, the patient was started on sulfasalazine for rheumatoid arthritis by her rheumatologist and the dose was recently adjusted and the patient started having nausea and vomiting and she has not been able to keep anything down.  The patient's mom gives the patient sodium chloride tablets over-the- counter.  These were not suggested or recommended by anybody.  She thought that because IV fluids were keeping the patient coherent, she thought sodium  chloride was what was doing the trick and so she gives her sodium chloride tablets every day and reportedly in the last 3 days because of the nausea and vomiting, [name] was not able to keep the sodium chloride tablets down and they think that is what prompted her coming to the ER.  On further questioning, the patient's mom reports that [name] drinks about 5 cans of Gatorade a day.  This is in addition to multiple bottles of orange juice in addition to multiple liters of water.  [name] complains that despite all these fluids she feels she is very, very dehydrated and she has to urinate a lot.  She denies having any headache, chest pain, or shortness of breath.  She denies having any other complaints.  She does not have any diarrhea. She does not have any fever.  REVIEW OF SYSTEMS:  A complete review of systems was done, which include general; head, eyes, ears, nose and throat; cardiovascular; respiratory; GI; GU; endocrine; musculoskeletal; skin; neurologic; and psychiatric all were within normal limits other than what was mentioned  in the history of present illness.  PAST MEDICAL HISTORY: 1. Rheumatoid arthritis. 2. Major depressive disorder. 3. Iron-deficiency anemia. 4. Chronic pain.  SOCIAL HISTORY:  There is no history of smoking, alcohol, or illicit drugs.  Unfortunately, she has to drop out of school because of rheumatoid arthritis.  FAMILY HISTORY:  Noncontributory.  CURRENT MEDICATIONS AT HOME: 1. Calcium once a day. 2. Prednisone 5 mg b.i.d. 3. Trazodone 150 mg at bedtime. 4. Percocet 5/500 as needed for pain. 5. Metoclopramide 10 mg as needed. 6. Celexa 10 mg at bedtime. 7. Iron tablets once a day. 8. Sulfasalazine 500 mg b.i.d. 9. Humira specialized dosing. 10.Sodium chloride tablets, dose unknown.  ALLERGIES:  None.  PHYSICAL EXAMINATION:  VITAL SIGNS:  T-max of 102.7, respiratory rate 16, pulse rate 126, blood pressure 108/66, O2 sats  100%. GENERAL APPEARANCE:  Not in acute distress.  The patient is tearful. Alert, awake, and oriented to time, place, person, and situation. Afebrile. HEENT:  Normocephalic, atraumatic.  Pupils are equal and reactive to light and accommodation.  Extraocular muscles are intact.  Mucous membranes are extremely dry. NECK:  Supple.  No JVD, lymphadenopathy, or carotid bruit. CVS:  Tachycardic, regular rhythm.  No murmurs, rubs or gallops. LUNGS:  Clear to auscultation bilaterally. ABDOMEN:  Soft, nontender, nondistended, no hepatosplenomegaly. EXTREMITIES:  No clubbing, cyanosis, or edema. NEUROLOGIC:  Grossly nonfocal.  LABORATORY DATA AND STUDIES: 1. Urine pregnancy negative. 2. Acute abdominal series, no acute cardiopulmonary disease.     Nonspecific nonobstructive bowel gas pattern.  Degenerative changes     noted in the hips. 3. Lipase 23, sodium 139, potassium 3.7, chloride 108, bicarb 24, BUN     7, creatinine 0.63, blood glucose 99.  Calcium 7.2, total protein     5.6, albumin 2.7, ALT 17, AST 26, total bili 0.7, alkaline     phosphatase 43.  WBC 4100, hemoglobin 1.7, hematocrit 35, platelets     147,000.  Neutrophils 91%.  ASSESSMENT AND PLAN: 1. Nausea and vomiting.  Unclear what the etiology is.  Could be     secondary to the medication that was recently started.  We will     symptomatically treat her and replenish her the loss of the     intravascular volume. 2. Urinary tract infection.  We will have her on Rocephin 1 g IV q.24     h.  We will change antibiotics depending on the culture. 3. Dehydration.  The patient's mucous membranes are moist excepting     her lips, which are extremely chapped.  The patient received 4 L of     fluid in the ER and the family and the patient insist that she     needs more liquids.  She is used to drinking gallons of fluids     every day.  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  Her sodium level is normal but  she has been getting     sodium chloride tablets as an outpatient.  The possibility of     diabetes insipidus exists as well.  We will check the serum and     urine osmolalities.  We will have psychiatric come assess the     patient and help [place] with further recommendations. 4. Rheumatoid arthritis.  We will continue home medications. 5. Deep venous thrombosis prophylaxis, heparin. 6. Code status is full.     Lucile Crater, MD     TA/MEDQ  D:  09/12/2010  T:  09/12/2010  Job:  [id number]  Electronically Signed by Lucile Crater MD on 12/15/2010 04:11:51 PM

## 2011-01-05 LAB — URINALYSIS, ROUTINE W REFLEX MICROSCOPIC
Bilirubin Urine: NEGATIVE
Glucose, UA: NEGATIVE mg/dL
Nitrite: NEGATIVE
Protein, ur: NEGATIVE mg/dL
Specific Gravity, Urine: 1.008 (ref 1.005–1.030)
Specific Gravity, Urine: 1.009 (ref 1.005–1.030)
Urobilinogen, UA: 1 mg/dL (ref 0.0–1.0)
pH: 6.5 (ref 5.0–8.0)

## 2011-01-05 LAB — COMPREHENSIVE METABOLIC PANEL
Albumin: 2.7 g/dL — ABNORMAL LOW (ref 3.5–5.2)
Alkaline Phosphatase: 43 U/L (ref 39–117)
BUN: 7 mg/dL (ref 6–23)
CO2: 24 mEq/L (ref 19–32)
Chloride: 108 mEq/L (ref 96–112)
Creatinine, Ser: 0.63 mg/dL (ref 0.4–1.2)
GFR calc non Af Amer: >60 mL/min (ref >60)
Potassium: 3.7 mEq/L (ref 3.5–5.1)
Sodium: 139 mEq/L (ref 135–145)
Total Bilirubin: 0.7 mg/dL (ref 0.3–1.2)

## 2011-01-05 LAB — URINE CULTURE

## 2011-01-05 LAB — CBC
HCT: 33.3 % — ABNORMAL LOW (ref 36.0–46.0)
HCT: 34 % — ABNORMAL LOW (ref 36.0–46.0)
HCT: 35.5 % — ABNORMAL LOW (ref 36.0–46.0)
Hemoglobin: 11 g/dL — ABNORMAL LOW (ref 12.0–15.0)
Hemoglobin: 11.3 g/dL — ABNORMAL LOW (ref 12.0–15.0)
Hemoglobin: 11.7 g/dL — ABNORMAL LOW (ref 12.0–15.0)
MCH: 25.8 pg — ABNORMAL LOW (ref 26.0–34.0)
MCH: 26 pg (ref 26.0–34.0)
MCHC: 33 g/dL (ref 30.0–36.0)
MCHC: 33.2 g/dL (ref 30.0–36.0)
MCHC: 33.2 g/dL (ref 30.0–36.0)
MCV: 77.6 fL — ABNORMAL LOW (ref 78.0–100.0)
MCV: 78.4 fL (ref 78.0–100.0)
MCV: 78.5 fL (ref 78.0–100.0)
Platelets: 147 10*3/uL — ABNORMAL LOW (ref 150–400)
Platelets: 176 10*3/uL (ref 150–400)
Platelets: 180 10*3/uL (ref 150–400)
RBC: 4.39 MIL/uL (ref 3.87–5.11)
RDW: 19.5 % — ABNORMAL HIGH (ref 11.5–15.5)
RDW: 19.5 % — ABNORMAL HIGH (ref 11.5–15.5)
WBC: 3.4 10*3/uL — ABNORMAL LOW (ref 4.0–10.5)
WBC: 4.1 10*3/uL (ref 4.0–10.5)

## 2011-01-05 LAB — BASIC METABOLIC PANEL
BUN: 3 mg/dL — ABNORMAL LOW (ref 6–23)
BUN: 3 mg/dL — ABNORMAL LOW (ref 6–23)
CO2: 23 mEq/L (ref 19–32)
CO2: 25 mEq/L (ref 19–32)
Chloride: 105 mEq/L (ref 96–112)
Chloride: 107 mEq/L (ref 96–112)
Creatinine, Ser: 0.6 mg/dL (ref 0.4–1.2)
GFR calc non Af Amer: >60 mL/min (ref >60)
Glucose, Bld: 121 mg/dL — ABNORMAL HIGH (ref 70–99)
Potassium: 3.1 mEq/L — ABNORMAL LOW (ref 3.5–5.1)
Potassium: 4 mEq/L (ref 3.5–5.1)
Sodium: 138 mEq/L (ref 135–145)

## 2011-01-05 LAB — URINE MICROSCOPIC-ADD ON

## 2011-01-05 LAB — LIPASE, BLOOD: Lipase: 23 U/L (ref 11–59)

## 2011-01-05 LAB — DIFFERENTIAL
Basophils Absolute: 0 10*3/uL (ref 0.0–0.1)
Eosinophils Absolute: 0.1 10*3/uL (ref 0.0–0.7)
Eosinophils Relative: 2 % (ref 0–5)
Eosinophils Relative: 3 % (ref 0–5)
Lymphocytes Relative: 1 % — ABNORMAL LOW (ref 12–46)
Lymphocytes Relative: 14 % (ref 12–46)
Lymphs Abs: 0.5 10*3/uL — ABNORMAL LOW (ref 0.7–4.0)
Monocytes Absolute: 0.3 10*3/uL (ref 0.1–1.0)
Neutrophils Relative %: 91 % — ABNORMAL HIGH (ref 43–77)

## 2011-01-05 LAB — MAGNESIUM: Magnesium: 1.8 mg/dL (ref 1.5–2.5)

## 2011-01-10 LAB — URINALYSIS, ROUTINE W REFLEX MICROSCOPIC
Ketones, ur: NEGATIVE mg/dL
Protein, ur: 30 mg/dL — AB
Urobilinogen, UA: 4 mg/dL — ABNORMAL HIGH (ref 0.0–1.0)

## 2011-01-10 LAB — URINE MICROSCOPIC-ADD ON

## 2011-01-10 LAB — COMPREHENSIVE METABOLIC PANEL
Albumin: 2.6 g/dL — ABNORMAL LOW (ref 3.5–5.2)
Alkaline Phosphatase: 48 U/L (ref 39–117)
BUN: 6 mg/dL (ref 6–23)
Chloride: 106 mEq/L (ref 96–112)
Glucose, Bld: 141 mg/dL — ABNORMAL HIGH (ref 70–99)
Potassium: 3.4 mEq/L — ABNORMAL LOW (ref 3.5–5.1)
Total Bilirubin: 0.5 mg/dL (ref 0.3–1.2)

## 2011-01-10 LAB — DIFFERENTIAL
Basophils Absolute: 0 10*3/uL (ref 0.0–0.1)
Basophils Relative: 1 % (ref 0–1)
Eosinophils Absolute: 0 10*3/uL (ref 0.0–0.7)
Monocytes Absolute: 0.3 10*3/uL (ref 0.1–1.0)
Neutro Abs: 2.2 10*3/uL (ref 1.7–7.7)
Neutrophils Relative %: 71 % (ref 43–77)

## 2011-01-10 LAB — CBC
HCT: 27.5 % — ABNORMAL LOW (ref 36.0–46.0)
Hemoglobin: 8.8 g/dL — ABNORMAL LOW (ref 12.0–15.0)
WBC: 3.1 10*3/uL — ABNORMAL LOW (ref 4.0–10.5)

## 2011-01-13 LAB — URINALYSIS, ROUTINE W REFLEX MICROSCOPIC
Bilirubin Urine: NEGATIVE
Glucose, UA: NEGATIVE mg/dL
Ketones, ur: 15 mg/dL — AB
Nitrite: NEGATIVE
Protein, ur: NEGATIVE mg/dL
pH: 6 (ref 5.0–8.0)

## 2011-01-13 LAB — DIFFERENTIAL
Basophils Absolute: 0 10*3/uL (ref 0.0–0.1)
Basophils Absolute: 0 10*3/uL (ref 0.0–0.1)
Eosinophils Absolute: 0 10*3/uL (ref 0.0–0.7)
Eosinophils Absolute: 0 10*3/uL (ref 0.0–0.7)
Lymphocytes Relative: 22 % (ref 12–46)
Lymphs Abs: 0.7 10*3/uL (ref 0.7–4.0)
Monocytes Absolute: 0.3 10*3/uL (ref 0.1–1.0)
Monocytes Absolute: 0.3 10*3/uL (ref 0.1–1.0)
Neutro Abs: 3.3 10*3/uL (ref 1.7–7.7)
Neutrophils Relative %: 66 % (ref 43–77)

## 2011-01-13 LAB — CBC
HCT: 27.3 % — ABNORMAL LOW (ref 36.0–46.0)
Hemoglobin: 8.6 g/dL — ABNORMAL LOW (ref 12.0–15.0)
MCV: 63.5 fL — ABNORMAL LOW (ref 78.0–100.0)
Platelets: 508 10*3/uL — ABNORMAL HIGH (ref 150–400)
RBC: 4.23 MIL/uL (ref 3.87–5.11)
RBC: 4.47 MIL/uL (ref 3.87–5.11)
RDW: 19.4 % — ABNORMAL HIGH (ref 11.5–15.5)
WBC: 3.2 10*3/uL — ABNORMAL LOW (ref 4.0–10.5)
WBC: 4.3 10*3/uL (ref 4.0–10.5)

## 2011-01-13 LAB — COMPREHENSIVE METABOLIC PANEL
ALT: 8 U/L (ref 0–35)
ALT: <8 U/L (ref 0–35)
AST: 13 U/L (ref 0–37)
Albumin: 2.9 g/dL — ABNORMAL LOW (ref 3.5–5.2)
Alkaline Phosphatase: 62 U/L (ref 39–117)
Alkaline Phosphatase: 66 U/L (ref 39–117)
BUN: 7 mg/dL (ref 6–23)
CO2: 24 mEq/L (ref 19–32)
CO2: 25 mEq/L (ref 19–32)
Chloride: 106 mEq/L (ref 96–112)
Chloride: 108 mEq/L (ref 96–112)
GFR calc Af Amer: >60 mL/min (ref >60)
GFR calc non Af Amer: >60 mL/min (ref >60)
Glucose, Bld: 79 mg/dL (ref 70–99)
Potassium: 3.7 mEq/L (ref 3.5–5.1)
Potassium: 3.7 mEq/L (ref 3.5–5.1)
Sodium: 137 mEq/L (ref 135–145)
Total Bilirubin: 0.5 mg/dL (ref 0.3–1.2)
Total Bilirubin: 0.9 mg/dL (ref 0.3–1.2)

## 2011-01-13 LAB — URINE MICROSCOPIC-ADD ON

## 2011-01-13 LAB — LIPASE, BLOOD: Lipase: 17 U/L (ref 11–59)

## 2011-01-17 LAB — URINE MICROSCOPIC-ADD ON

## 2011-01-17 LAB — BASIC METABOLIC PANEL
BUN: 2 mg/dL — ABNORMAL LOW (ref 6–23)
BUN: 3 mg/dL — ABNORMAL LOW (ref 6–23)
CO2: 25 mEq/L (ref 19–32)
Calcium: 8 mg/dL — ABNORMAL LOW (ref 8.4–10.5)
Calcium: 8.6 mg/dL (ref 8.4–10.5)
Chloride: 107 mEq/L (ref 96–112)
GFR calc Af Amer: >60 mL/min (ref >60)
GFR calc Af Amer: >60 mL/min (ref >60)
GFR calc non Af Amer: >60 mL/min (ref >60)
GFR calc non Af Amer: >60 mL/min (ref >60)
Glucose, Bld: 104 mg/dL — ABNORMAL HIGH (ref 70–99)
Glucose, Bld: 89 mg/dL (ref 70–99)
Glucose, Bld: 97 mg/dL (ref 70–99)
Potassium: 3.3 mEq/L — ABNORMAL LOW (ref 3.5–5.1)
Potassium: 3.4 mEq/L — ABNORMAL LOW (ref 3.5–5.1)
Potassium: 3.9 mEq/L (ref 3.5–5.1)
Sodium: 138 mEq/L (ref 135–145)
Sodium: 140 mEq/L (ref 135–145)

## 2011-01-17 LAB — DIFFERENTIAL
Basophils Absolute: 0 10*3/uL (ref 0.0–0.1)
Basophils Absolute: 0 10*3/uL (ref 0.0–0.1)
Basophils Relative: 1 % (ref 0–1)
Basophils Relative: 1 % (ref 0–1)
Eosinophils Relative: 0 % (ref 0–5)
Eosinophils Relative: 1 % (ref 0–5)
Eosinophils Relative: 1 % (ref 0–5)
Lymphocytes Relative: 17 % (ref 12–46)
Lymphocytes Relative: 6 % — ABNORMAL LOW (ref 12–46)
Lymphs Abs: 0.4 10*3/uL — ABNORMAL LOW (ref 0.7–4.0)
Monocytes Absolute: 0.5 10*3/uL (ref 0.1–1.0)
Monocytes Relative: 11 % (ref 3–12)
Monocytes Relative: 7 % (ref 3–12)
Monocytes Relative: 9 % (ref 3–12)
Neutro Abs: 2.3 10*3/uL (ref 1.7–7.7)
Neutro Abs: 6.4 10*3/uL (ref 1.7–7.7)
Neutrophils Relative %: 74 % (ref 43–77)

## 2011-01-17 LAB — URINALYSIS, ROUTINE W REFLEX MICROSCOPIC
Bilirubin Urine: NEGATIVE
Glucose, UA: NEGATIVE mg/dL
Hgb urine dipstick: NEGATIVE
Protein, ur: NEGATIVE mg/dL
Urobilinogen, UA: 1 mg/dL (ref 0.0–1.0)

## 2011-01-17 LAB — TSH
TSH: 1.155 u[IU]/mL (ref 0.350–4.500)
TSH: 1.554 u[IU]/mL (ref 0.350–4.500)

## 2011-01-17 LAB — URINE CULTURE: Colony Count: 75000

## 2011-01-17 LAB — CBC
HCT: 24.7 % — ABNORMAL LOW (ref 36.0–46.0)
HCT: 26.1 % — ABNORMAL LOW (ref 36.0–46.0)
HCT: 26.6 % — ABNORMAL LOW (ref 36.0–46.0)
HCT: 28.2 % — ABNORMAL LOW (ref 36.0–46.0)
HCT: 29 % — ABNORMAL LOW (ref 36.0–46.0)
Hemoglobin: 7.9 g/dL — ABNORMAL LOW (ref 12.0–15.0)
Hemoglobin: 8 g/dL — ABNORMAL LOW (ref 12.0–15.0)
Hemoglobin: 8.6 g/dL — ABNORMAL LOW (ref 12.0–15.0)
MCHC: 30.5 g/dL (ref 30.0–36.0)
MCV: 62.8 fL — ABNORMAL LOW (ref 78.0–100.0)
Platelets: 450 10*3/uL — ABNORMAL HIGH (ref 150–400)
Platelets: 523 10*3/uL — ABNORMAL HIGH (ref 150–400)
RBC: 4.24 MIL/uL (ref 3.87–5.11)
RBC: 4.44 MIL/uL (ref 3.87–5.11)
RBC: 4.61 MIL/uL (ref 3.87–5.11)
RDW: 19.8 % — ABNORMAL HIGH (ref 11.5–15.5)
WBC: 3.3 10*3/uL — ABNORMAL LOW (ref 4.0–10.5)
WBC: 3.8 10*3/uL — ABNORMAL LOW (ref 4.0–10.5)
WBC: 4 10*3/uL (ref 4.0–10.5)
WBC: 7.4 10*3/uL (ref 4.0–10.5)

## 2011-01-17 LAB — IRON AND TIBC

## 2011-01-17 LAB — VITAMIN B12: Vitamin B-12: 237 pg/mL (ref 211–911)

## 2011-01-17 LAB — HEMOGLOBIN A1C: Mean Plasma Glucose: 120 mg/dL

## 2011-01-25 LAB — URINALYSIS, ROUTINE W REFLEX MICROSCOPIC
Ketones, ur: NEGATIVE mg/dL
Protein, ur: NEGATIVE mg/dL
Urobilinogen, UA: 1 mg/dL (ref 0.0–1.0)

## 2011-01-25 LAB — URINE MICROSCOPIC-ADD ON

## 2011-01-26 LAB — POCT I-STAT, CHEM 8
BUN: 7 mg/dL (ref 6–23)
Creatinine, Ser: 0.5 mg/dL (ref 0.4–1.2)
Hemoglobin: 11.6 g/dL — ABNORMAL LOW (ref 12.0–15.0)
Potassium: 3.4 mEq/L — ABNORMAL LOW (ref 3.5–5.1)
Sodium: 137 mEq/L (ref 135–145)

## 2011-01-26 LAB — CBC
RBC: 4.59 MIL/uL (ref 3.87–5.11)
WBC: 5.3 10*3/uL (ref 4.0–10.5)

## 2011-02-28 HISTORY — PX: NASAL SEPTUM SURGERY: SHX37

## 2011-06-14 ENCOUNTER — Ambulatory Visit (HOSPITAL_BASED_OUTPATIENT_CLINIC_OR_DEPARTMENT_OTHER)
Admission: RE | Admit: 2011-06-14 | Discharge: 2011-06-14 | Disposition: A | Discharge status: Home / Self Care | Payer: Medicaid Other | Source: Ambulatory Visit | Attending: Otolaryngology | Admitting: Otolaryngology

## 2011-06-14 DIAGNOSIS — M069 Rheumatoid arthritis, unspecified: Secondary | ICD-10-CM | POA: Insufficient documentation

## 2011-06-14 DIAGNOSIS — D649 Anemia, unspecified: Secondary | ICD-10-CM | POA: Insufficient documentation

## 2011-06-14 DIAGNOSIS — Z01812 Encounter for preprocedural laboratory examination: Secondary | ICD-10-CM | POA: Insufficient documentation

## 2011-06-14 DIAGNOSIS — J342 Deviated nasal septum: Secondary | ICD-10-CM | POA: Insufficient documentation

## 2011-06-14 DIAGNOSIS — K219 Gastro-esophageal reflux disease without esophagitis: Secondary | ICD-10-CM | POA: Insufficient documentation

## 2011-06-14 DIAGNOSIS — F329 Major depressive disorder, single episode, unspecified: Secondary | ICD-10-CM | POA: Insufficient documentation

## 2011-06-14 DIAGNOSIS — F3289 Other specified depressive episodes: Secondary | ICD-10-CM | POA: Insufficient documentation

## 2011-06-14 LAB — POCT HEMOGLOBIN-HEMACUE: Hemoglobin: 13.5 g/dL (ref 12.0–15.0)

## 2011-06-30 ENCOUNTER — Inpatient Hospital Stay (INDEPENDENT_AMBULATORY_CARE_PROVIDER_SITE_OTHER)
Admission: RE | Admit: 2011-06-30 | Discharge: 2011-06-30 | Disposition: A | Discharge status: Home / Self Care | Payer: Medicaid Other | Source: Ambulatory Visit | Attending: Emergency Medicine | Admitting: Emergency Medicine

## 2011-06-30 DIAGNOSIS — T6391XA Toxic effect of contact with unspecified venomous animal, accidental (unintentional), initial encounter: Secondary | ICD-10-CM

## 2011-08-03 NOTE — Op Note (Signed)
  NAME:  [name] [name], DIETZMAN                 ACCOUNT NO.:  [id number]  MEDICAL RECORD NO.:  [id number]  LOCATION:                                 FACILITY:  PHYSICIAN:  Eaven Schwager H. Pollyann Kennedy, MD     DATE OF BIRTH:  [birth year]  DATE OF PROCEDURE:  06/14/2011 DATE OF DISCHARGE:                              OPERATIVE REPORT   PREOPERATIVE DIAGNOSIS:  Nasal septal deviation.  POSTOPERATIVE DIAGNOSIS:  Nasal septal deviation.  PROCEDURE:  Nasal septoplasty.  SURGEON:  Danise Dehne H. Pollyann Kennedy, MD.  ANESTHESIA:  General endotracheal anesthesia was used.  COMPLICATIONS:  No complications.  BLOOD LOSS:  Minimal.  FINDINGS:  Severe leftward septal deviation with extension of the quadrangular cartilage over a leftward banding maxillary crest.  A large ethmoid bony spur posteriorly on the left side and a cartilaginous spur on the right side anteriorly adjacent to the right side of the maxillary crest.  REFERRING PHYSICIAN:  Corwin Levins, [facility], [facility].HISTORY:  This is a 26 year old with a history of nasal obstruction. Risks, benefits, alternatives, and complications of procedure were explained to the patient and her mother seemed to understand and agreed to surgery.  PROCEDURE:  The patient was taken to the operating room, placed on the operating table in supine position.  Following induction of general endotracheal anesthesia, the table was positioned and the patient was prepped and draped in standard fashion.  Afrin spray was used preoperatively in the nasal cavities.  1% Xylocaine with epinephrine was infiltrated into the septum, columella, and the floor of the nasal cavity on the left.  Afrin pledgets were placed bilaterally.  A left hemitransfixion incision was used to approach the septal cartilage and mucoperichondrial flap was developed posteriorly down the left side. There was some difficulty in getting around the severe spurring on the left and a mucosal tear did occur but  corresponding mucosa on the right side was intact.  The bony cartilaginous junction was divided and a mucosal flap was developed on the right side posteriorly.  The ethmoid bone was resected in areas that were deviated.  The left maxillary crest was taken down using a 4-mm osteotome.  The cartilaginous spur on both sides was shaved down using a scalpel.  These maneuvers greatly enhanced the positioning of the septum and allowed it to lie straight and flat in the midline with no further obstruction.  The mucosal incision was reapproximated with 4-0 chromic suture.  A 4-0 plain gut quilting suture was used to quilt the septal flaps.  The nasal cavities were packed with rolled up Telfa coated with bacitracin ointment and the pharynx suctioned of blood and secretions.  The patient was awakened, extubated, and transferred to recovery in stable condition.     [name] H. Pollyann Kennedy, MD     JHR/MEDQ  D:  06/14/2011  T:  06/14/2011  Job:  [id number]  cc:   Corwin Levins, MD  Electronically Signed by Serena Colonel MD on 07/28/2011 09:29:05 PM

## 2012-01-02 IMAGING — CR DG ABDOMEN ACUTE W/ 1V CHEST
4 series · 4 of 4 positions shown · non-contrast
Comparison: None.

CLINICAL DATA: Nausea

ACUTE ABDOMEN SERIES (ABDOMEN 2 VIEW & CHEST 1 VIEW)

[w abdomen decub]
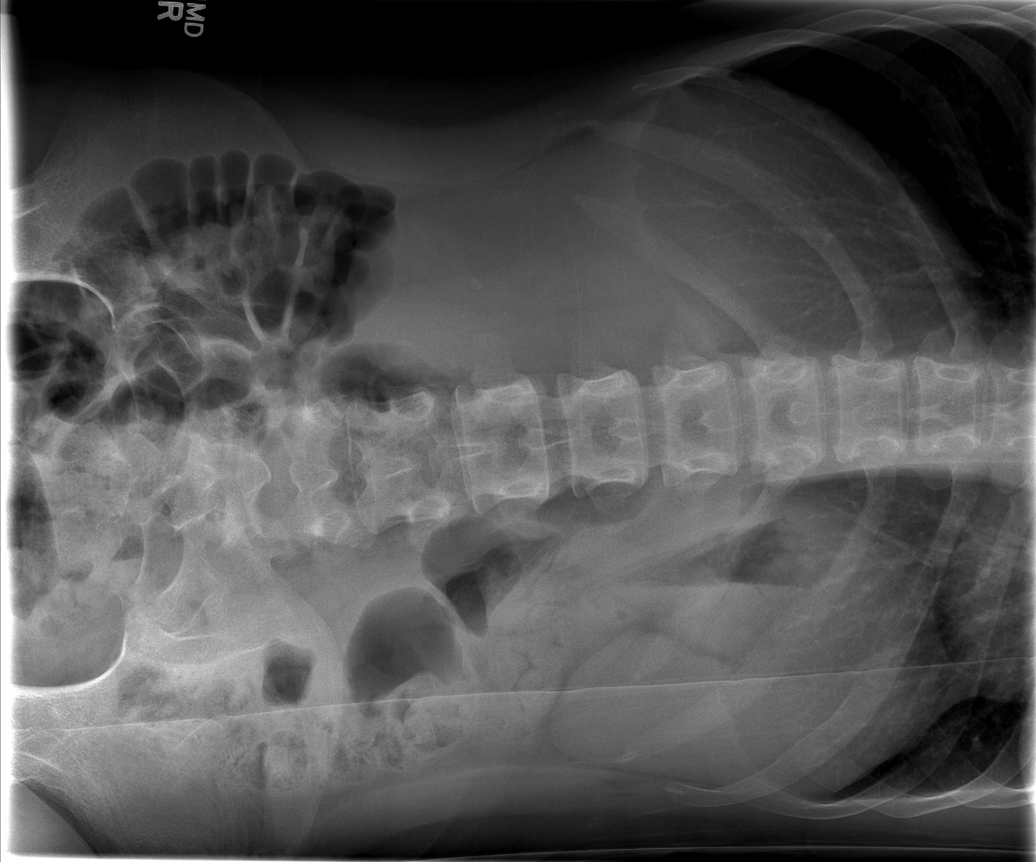

[t abdomen supine (1 of 2)]
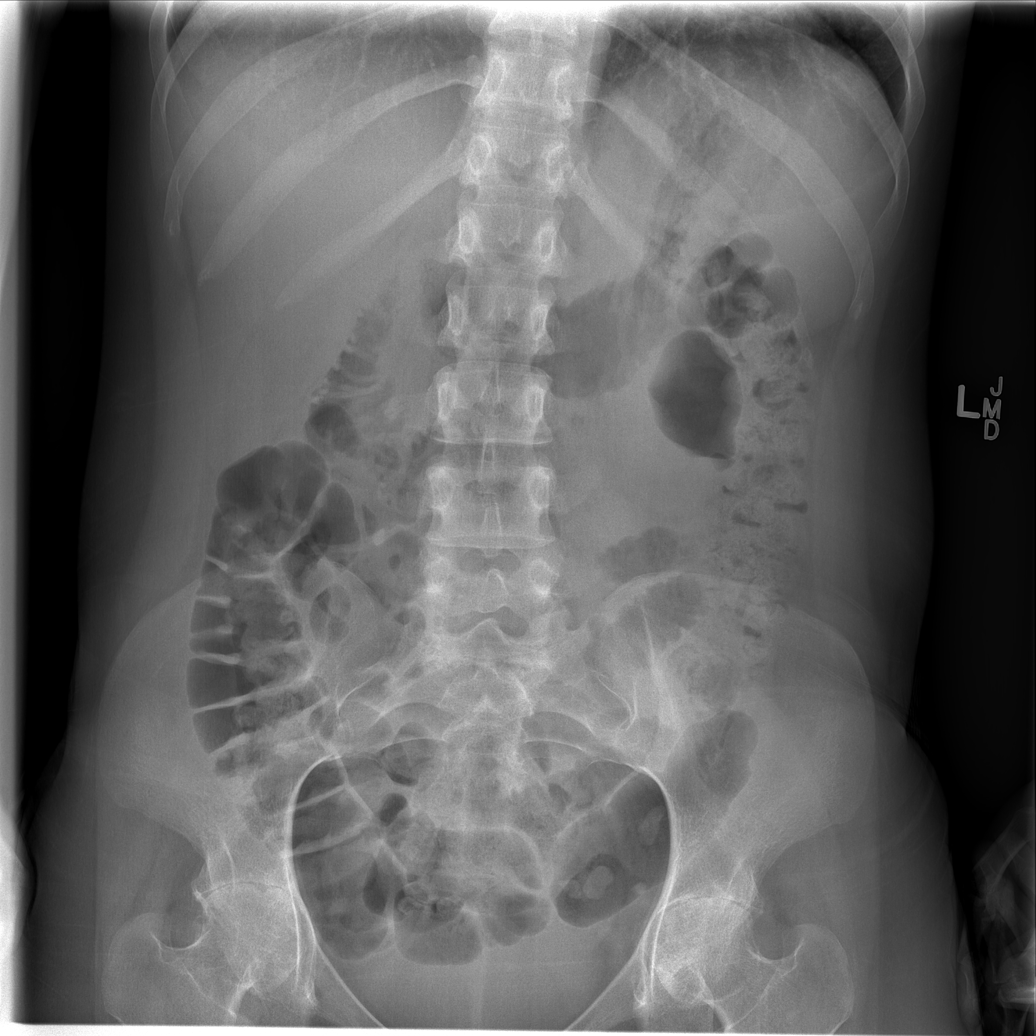

[t abdomen supine (2 of 2)]
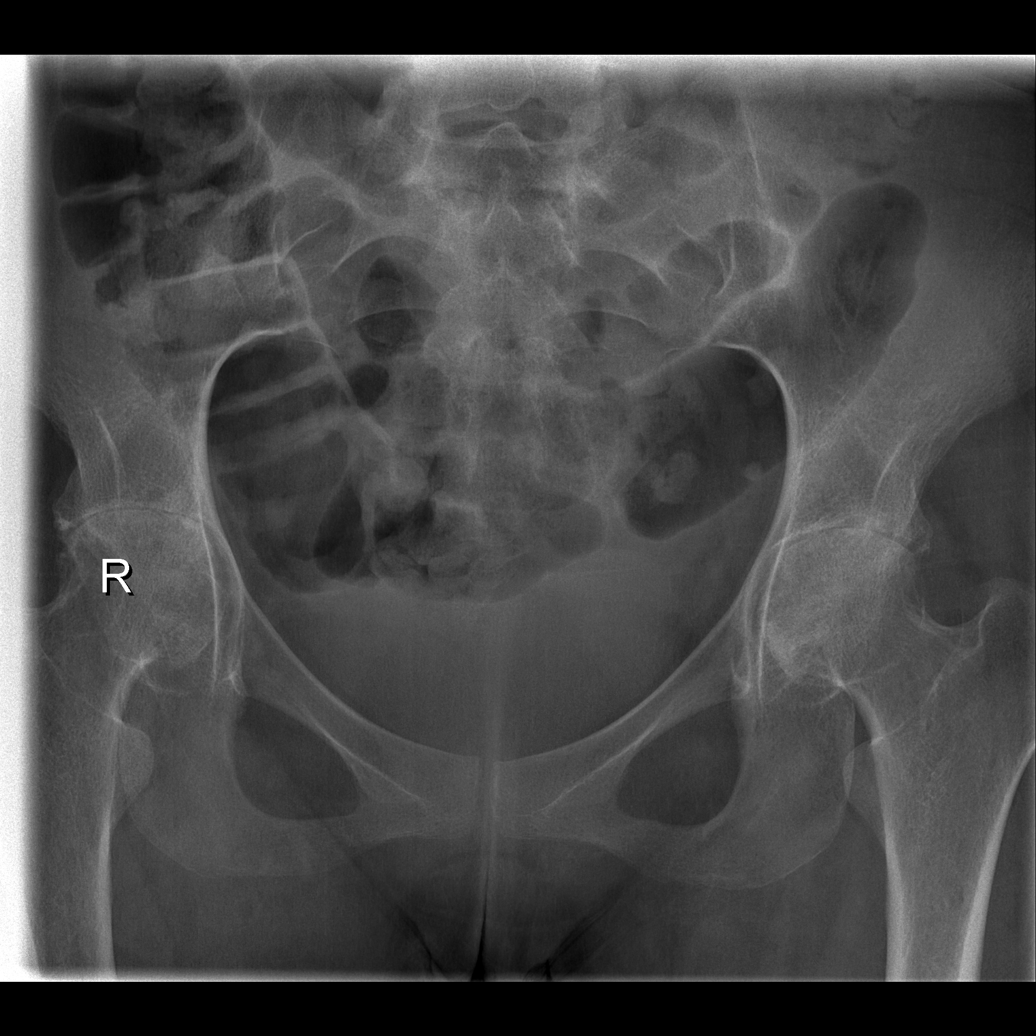

[view not recorded]
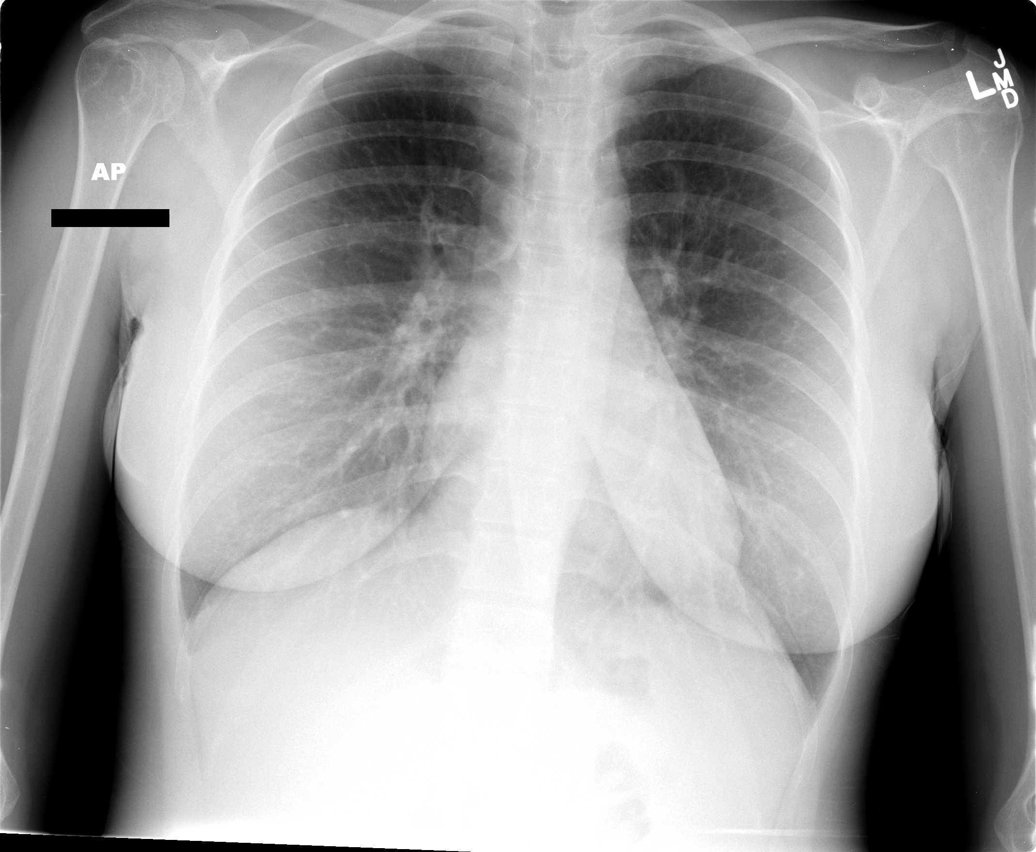

[4 of 4 positions shown; findings below may reference images not displayed]

FINDINGS: The lungs are clear.  The heart is normal in size.  There
is no intra-abdominal free air present.  No dilated small bowel
loops are identified.  There is gas and stool seen throughout the
colon.  No unexpected calcifications are identified.  Advanced
degenerative changes  are seen in the hips, bilaterally considering
the patient's age.  The bones are otherwise unremarkable.
IMPRESSION: 1.  No acute cardiopulmonary disease.
2.  Nonspecific, nonobstructive bowel gas pattern.
3.  Degenerative changes noted in the hips, advanced for the
patient's age.

## 2012-03-23 ENCOUNTER — Other Ambulatory Visit (HOSPITAL_COMMUNITY): Payer: Self-pay | Admitting: Orthopaedic Surgery

## 2012-04-19 ENCOUNTER — Encounter (HOSPITAL_COMMUNITY): Payer: Self-pay | Admitting: Pharmacy Technician

## 2012-04-20 ENCOUNTER — Encounter (HOSPITAL_COMMUNITY): Payer: Self-pay

## 2012-04-20 ENCOUNTER — Encounter (HOSPITAL_COMMUNITY)
Admission: RE | Admit: 2012-04-20 | Discharge: 2012-04-20 | Disposition: A | Discharge status: Home / Self Care | Payer: Medicaid Other | Source: Ambulatory Visit | Attending: Orthopaedic Surgery | Admitting: Orthopaedic Surgery

## 2012-04-20 HISTORY — DX: Depression, unspecified: F32.A

## 2012-04-20 HISTORY — DX: Generalized hyperhidrosis: R61

## 2012-04-20 HISTORY — DX: Irritable bowel syndrome, unspecified: K58.9

## 2012-04-20 HISTORY — DX: Scoliosis, unspecified: M41.9

## 2012-04-20 HISTORY — DX: Gastro-esophageal reflux disease without esophagitis: K21.9

## 2012-04-20 HISTORY — DX: Major depressive disorder, single episode, unspecified: F32.9

## 2012-04-20 HISTORY — DX: Anemia, unspecified: D64.9

## 2012-04-20 HISTORY — DX: Headache: R51

## 2012-04-20 HISTORY — DX: Scar conditions and fibrosis of skin: L90.5

## 2012-04-20 HISTORY — DX: Unspecified osteoarthritis, unspecified site: M19.90

## 2012-04-20 LAB — SURGICAL PCR SCREEN
MRSA, PCR: NEGATIVE
Staphylococcus aureus: POSITIVE — AB

## 2012-04-20 LAB — URINE MICROSCOPIC-ADD ON

## 2012-04-20 LAB — BASIC METABOLIC PANEL
Chloride: 104 mEq/L (ref 96–112)
Creatinine, Ser: 0.54 mg/dL (ref 0.50–1.10)
GFR calc Af Amer: >90 mL/min (ref >90)
GFR calc non Af Amer: >90 mL/min (ref >90)
Potassium: 4.1 mEq/L (ref 3.5–5.1)

## 2012-04-20 LAB — URINALYSIS, ROUTINE W REFLEX MICROSCOPIC
Hgb urine dipstick: NEGATIVE
Nitrite: NEGATIVE
Protein, ur: 30 mg/dL — AB
Specific Gravity, Urine: 1.035 — ABNORMAL HIGH (ref 1.005–1.030)
Urobilinogen, UA: 1 mg/dL (ref 0.0–1.0)

## 2012-04-20 LAB — CBC
HCT: 43.3 % (ref 36.0–46.0)
RDW: 13.8 % (ref 11.5–15.5)
WBC: 5.1 10*3/uL (ref 4.0–10.5)

## 2012-04-20 LAB — PROTIME-INR
INR: 0.96 (ref 0.00–1.49)
Prothrombin Time: 13 seconds (ref 11.6–15.2)

## 2012-04-20 NOTE — Patient Instructions (Signed)
YOUR SURGERY IS SCHEDULED ON:  Friday  7/5  AT 1:15 PM  REPORT TO [facility] AT:  11:00 AM      PHONE # FOR SHORT STAY IS [phone]  DO NOT EAT OR DRINK ANYTHING AFTER MIDNIGHT THE NIGHT BEFORE YOUR SURGERY.  YOU MAY BRUSH YOUR TEETH, RINSE OUT YOUR MOUTH--BUT NO WATER, NO FOOD, NO CHEWING GUM, NO MINTS, NO CANDIES, NO CHEWING TOBACCO.  PLEASE TAKE THE FOLLOWING MEDICATIONS THE AM OF YOUR SURGERY WITH A FEW SIPS OF WATER:  CYMBALTA AND HYDROCODONE / ACETAMINOPHEN (VICODIN)    IF YOU USE INHALERS--USE YOUR INHALERS THE AM OF YOUR SURGERY AND BRING INHALERS TO THE HOSPITAL -TAKE TO SURGERY.    IF YOU ARE DIABETIC:  DO NOT TAKE ANY DIABETIC MEDICATIONS THE AM OF YOUR SURGERY.  IF YOU TAKE INSULIN IN THE EVENINGS--PLEASE ONLY TAKE 1/2 NORMAL EVENING DOSE THE NIGHT BEFORE YOUR SURGERY.  NO INSULIN THE AM OF YOUR SURGERY.  IF YOU HAVE SLEEP APNEA AND USE CPAP OR BIPAP--PLEASE BRING THE MASK --NOT THE MACHINE-NOT THE TUBING   -JUST THE MASK. DO NOT BRING VALUABLES, MONEY, CREDIT CARDS.  CONTACT LENS, DENTURES / PARTIALS, GLASSES SHOULD NOT BE WORN TO SURGERY AND IN MOST CASES-HEARING AIDS WILL NEED TO BE REMOVED.  BRING YOUR GLASSES CASE, ANY EQUIPMENT NEEDED FOR YOUR CONTACT LENS. FOR PATIENTS ADMITTED TO THE HOSPITAL--CHECK OUT TIME THE DAY OF DISCHARGE IS 11:00 AM.  ALL INPATIENT ROOMS ARE PRIVATE - WITH BATHROOM, TELEPHONE, TELEVISION AND WIFI INTERNET. IF YOU ARE BEING DISCHARGED THE SAME DAY OF YOUR SURGERY--YOU CAN NOT DRIVE YOURSELF HOME--AND SHOULD NOT GO HOME ALONE BY TAXI OR BUS.  NO DRIVING OR OPERATING MACHINERY FOR 24 HOURS FOLLOWING ANESTHESIA / PAIN MEDICATIONS.                            SPECIAL INSTRUCTIONS:  CHLORHEXIDINE SOAP SHOWER (other brand names are Betasept and Hibiclens ) PLEASE SHOWER WITH CHLORHEXIDINE THE NIGHT BEFORE YOUR SURGERY AND THE AM OF YOUR SURGERY. DO NOT USE CHLORHEXIDINE ON YOUR FACE OR PRIVATE AREAS--YOU MAY USE YOUR NORMAL SOAP THOSE  AREAS AND YOUR NORMAL SHAMPOO.  WOMEN SHOULD AVOID SHAVING UNDER ARMS AND SHAVING LEGS 48 HOURS BEFORE USING CHLORHEXIDINE TO AVOID SKIN IRRITATION.  DO NOT USE IF ALLERGIC TO CHLORHEXIDINE.  PLEASE READ OVER ANY  FACT SHEETS THAT YOU WERE GIVEN: MRSA INFORMATION, BLOOD TRANSFUSION INFORMATION

## 2012-04-20 NOTE — Pre-Procedure Instructions (Signed)
CBC, BMET, PT, PTT, UA, T/S WERE DONE TODAY - PREOP - AT [facility]  AS PER ORDERS DR. Maureen Ralphs AND ANESTHESIOLOGIST'S GUIDELINES. PREOP INSTRUCTIONS DISCUSSED WITH PT USING TEACH BACK METHOD. SHERRIE AT DR. Alben Spittle OFFICE NOTIFIED THAT I FAXED TO DR. BLACKMAN COPY OF PT'S ABNORMAL UA  AND URINE MICROSCOPIC REPORTS.

## 2012-04-28 ENCOUNTER — Ambulatory Visit (HOSPITAL_COMMUNITY): Payer: Medicaid Other | Admitting: *Deleted

## 2012-04-28 ENCOUNTER — Encounter (HOSPITAL_COMMUNITY): Payer: Self-pay | Admitting: *Deleted

## 2012-04-28 ENCOUNTER — Ambulatory Visit (HOSPITAL_COMMUNITY): Payer: Medicaid Other

## 2012-04-28 ENCOUNTER — Encounter (HOSPITAL_COMMUNITY)
Admission: RE | Disposition: A | Discharge status: Home / Self Care | Payer: Self-pay | Source: Ambulatory Visit | Attending: Orthopaedic Surgery

## 2012-04-28 ENCOUNTER — Encounter (HOSPITAL_COMMUNITY): Payer: Self-pay | Admitting: Orthopedic Surgery

## 2012-04-28 ENCOUNTER — Inpatient Hospital Stay (HOSPITAL_COMMUNITY)
Admission: RE | Admit: 2012-04-28 | Discharge: 2012-04-30 | DRG: 470 | Disposition: A | Discharge status: Home / Self Care | Payer: Medicaid Other | Source: Ambulatory Visit | Attending: Orthopaedic Surgery | Admitting: Orthopaedic Surgery

## 2012-04-28 DIAGNOSIS — K219 Gastro-esophageal reflux disease without esophagitis: Secondary | ICD-10-CM | POA: Diagnosis present

## 2012-04-28 DIAGNOSIS — M247 Protrusio acetabuli: Secondary | ICD-10-CM | POA: Diagnosis present

## 2012-04-28 DIAGNOSIS — M4120 Other idiopathic scoliosis, site unspecified: Secondary | ICD-10-CM | POA: Diagnosis present

## 2012-04-28 DIAGNOSIS — M169 Osteoarthritis of hip, unspecified: Secondary | ICD-10-CM

## 2012-04-28 DIAGNOSIS — M069 Rheumatoid arthritis, unspecified: Secondary | ICD-10-CM | POA: Diagnosis present

## 2012-04-28 DIAGNOSIS — Z79899 Other long term (current) drug therapy: Secondary | ICD-10-CM

## 2012-04-28 DIAGNOSIS — K589 Irritable bowel syndrome without diarrhea: Secondary | ICD-10-CM | POA: Diagnosis present

## 2012-04-28 DIAGNOSIS — M1710 Unilateral primary osteoarthritis, unspecified knee: Principal | ICD-10-CM | POA: Diagnosis present

## 2012-04-28 DIAGNOSIS — Z01812 Encounter for preprocedural laboratory examination: Secondary | ICD-10-CM

## 2012-04-28 HISTORY — PX: TOTAL HIP ARTHROPLASTY: SHX124

## 2012-04-28 LAB — TYPE AND SCREEN: Antibody Screen: NEGATIVE

## 2012-04-28 SURGERY — ARTHROPLASTY, HIP, TOTAL, ANTERIOR APPROACH
Anesthesia: General | Site: Hip | Laterality: Right | Wound class: Clean

## 2012-04-28 MED ORDER — METHOTREXATE (ANTI-RHEUMATIC) 2.5 MG PO TABS
20.0000 mg | ORAL_TABLET | ORAL | Status: DC
  Administered 2012-04-29: 20 mg via ORAL
  Filled 2012-04-28: qty 8

## 2012-04-28 MED ORDER — METOCLOPRAMIDE HCL 5 MG/ML IJ SOLN: 5.0000 mg | Freq: Three times a day (TID) | INTRAMUSCULAR | Status: DC | PRN

## 2012-04-28 MED ORDER — CEFAZOLIN SODIUM 1-5 GM-% IV SOLN
1.0000 g | Freq: Four times a day (QID) | INTRAVENOUS | Status: AC
  Administered 2012-04-28 – 2012-04-29 (×2): 1 g via INTRAVENOUS
  Filled 2012-04-28 (×2): qty 50

## 2012-04-28 MED ORDER — ONDANSETRON HCL 4 MG PO TABS: 4.0000 mg | ORAL_TABLET | Freq: Four times a day (QID) | ORAL | Status: DC | PRN

## 2012-04-28 MED ORDER — ADALIMUMAB 40 MG/0.8ML ~~LOC~~ KIT: 40.0000 mg | PACK | SUBCUTANEOUS | Status: DC

## 2012-04-28 MED ORDER — ACETAMINOPHEN 10 MG/ML IV SOLN
INTRAVENOUS | Status: DC | PRN
  Administered 2012-04-28: 1000 mg via INTRAVENOUS

## 2012-04-28 MED ORDER — ACETAMINOPHEN 650 MG RE SUPP: 650.0000 mg | Freq: Four times a day (QID) | RECTAL | Status: DC | PRN

## 2012-04-28 MED ORDER — DIPHENHYDRAMINE HCL 50 MG/ML IJ SOLN: 12.5000 mg | Freq: Four times a day (QID) | INTRAMUSCULAR | Status: DC | PRN

## 2012-04-28 MED ORDER — METOCLOPRAMIDE HCL 10 MG PO TABS: 5.0000 mg | ORAL_TABLET | Freq: Three times a day (TID) | ORAL | Status: DC | PRN

## 2012-04-28 MED ORDER — PROMETHAZINE HCL 25 MG/ML IJ SOLN
6.2500 mg | INTRAMUSCULAR | Status: AC | PRN
  Administered 2012-04-28 (×2): 6.25 mg via INTRAVENOUS

## 2012-04-28 MED ORDER — NALOXONE HCL 0.4 MG/ML IJ SOLN: 0.4000 mg | INTRAMUSCULAR | Status: DC | PRN

## 2012-04-28 MED ORDER — ONDANSETRON HCL 4 MG/2ML IJ SOLN
INTRAMUSCULAR | Status: DC | PRN
  Administered 2012-04-28: 4 mg via INTRAVENOUS

## 2012-04-28 MED ORDER — ZOLPIDEM TARTRATE 5 MG PO TABS: 5.0000 mg | ORAL_TABLET | Freq: Every evening | ORAL | Status: DC | PRN

## 2012-04-28 MED ORDER — METHOCARBAMOL 100 MG/ML IJ SOLN
500.0000 mg | Freq: Four times a day (QID) | INTRAMUSCULAR | Status: DC | PRN
  Administered 2012-04-28: 500 mg via INTRAVENOUS
  Filled 2012-04-28: qty 5

## 2012-04-28 MED ORDER — FENTANYL CITRATE 0.05 MG/ML IJ SOLN
INTRAMUSCULAR | Status: DC | PRN
  Administered 2012-04-28 (×9): 50 ug via INTRAVENOUS
  Administered 2012-04-28: 25 ug via INTRAVENOUS
  Administered 2012-04-28: 50 ug via INTRAVENOUS

## 2012-04-28 MED ORDER — PHENOL 1.4 % MT LIQD
1.0000 | OROMUCOSAL | Status: DC | PRN
  Filled 2012-04-28: qty 177

## 2012-04-28 MED ORDER — NEOSTIGMINE METHYLSULFATE 1 MG/ML IJ SOLN
INTRAMUSCULAR | Status: DC | PRN
  Administered 2012-04-28: 3 mg via INTRAVENOUS

## 2012-04-28 MED ORDER — DOCUSATE SODIUM 100 MG PO CAPS
100.0000 mg | ORAL_CAPSULE | Freq: Two times a day (BID) | ORAL | Status: DC
  Administered 2012-04-28 – 2012-04-29 (×2): 100 mg via ORAL
  Filled 2012-04-28 (×4): qty 1

## 2012-04-28 MED ORDER — DEXAMETHASONE SODIUM PHOSPHATE 4 MG/ML IJ SOLN
INTRAMUSCULAR | Status: DC | PRN
  Administered 2012-04-28: 10 mg via INTRAVENOUS

## 2012-04-28 MED ORDER — LACTATED RINGERS IV SOLN: INTRAVENOUS | Status: DC

## 2012-04-28 MED ORDER — ACETAMINOPHEN 10 MG/ML IV SOLN
INTRAVENOUS | Status: AC
  Filled 2012-04-28: qty 100

## 2012-04-28 MED ORDER — DULOXETINE HCL 30 MG PO CPEP
30.0000 mg | ORAL_CAPSULE | Freq: Two times a day (BID) | ORAL | Status: DC
  Administered 2012-04-28 – 2012-04-30 (×4): 30 mg via ORAL
  Filled 2012-04-28 (×5): qty 1

## 2012-04-28 MED ORDER — 0.9 % SODIUM CHLORIDE (POUR BTL) OPTIME
TOPICAL | Status: DC | PRN
  Administered 2012-04-28: 1000 mL

## 2012-04-28 MED ORDER — HYDROMORPHONE HCL PF 1 MG/ML IJ SOLN
INTRAMUSCULAR | Status: DC | PRN
  Administered 2012-04-28 (×4): 0.5 mg via INTRAVENOUS

## 2012-04-28 MED ORDER — MORPHINE SULFATE (PF) 1 MG/ML IV SOLN
INTRAVENOUS | Status: AC
  Filled 2012-04-28: qty 25

## 2012-04-28 MED ORDER — METOCLOPRAMIDE HCL 5 MG/ML IJ SOLN
INTRAMUSCULAR | Status: DC | PRN
  Administered 2012-04-28 (×2): 5 mg via INTRAVENOUS

## 2012-04-28 MED ORDER — ONDANSETRON HCL 4 MG/2ML IJ SOLN: 4.0000 mg | Freq: Four times a day (QID) | INTRAMUSCULAR | Status: DC | PRN

## 2012-04-28 MED ORDER — SODIUM CHLORIDE 0.9 % IV SOLN
INTRAVENOUS | Status: DC
  Administered 2012-04-29: 06:00:00 via INTRAVENOUS

## 2012-04-28 MED ORDER — ROCURONIUM BROMIDE 100 MG/10ML IV SOLN
INTRAVENOUS | Status: DC | PRN
  Administered 2012-04-28: 10 mg via INTRAVENOUS
  Administered 2012-04-28: 30 mg via INTRAVENOUS
  Administered 2012-04-28: 10 mg via INTRAVENOUS

## 2012-04-28 MED ORDER — HYDROMORPHONE HCL PF 1 MG/ML IJ SOLN: 1.0000 mg | INTRAMUSCULAR | Status: DC | PRN

## 2012-04-28 MED ORDER — CEFAZOLIN SODIUM-DEXTROSE 2-3 GM-% IV SOLR
INTRAVENOUS | Status: AC
  Filled 2012-04-28: qty 50

## 2012-04-28 MED ORDER — ACETAMINOPHEN 325 MG PO TABS: 650.0000 mg | ORAL_TABLET | Freq: Four times a day (QID) | ORAL | Status: DC | PRN

## 2012-04-28 MED ORDER — HYDROMORPHONE HCL PF 1 MG/ML IJ SOLN
INTRAMUSCULAR | Status: AC
  Filled 2012-04-28: qty 1

## 2012-04-28 MED ORDER — MENTHOL 3 MG MT LOZG
1.0000 | LOZENGE | OROMUCOSAL | Status: DC | PRN
  Filled 2012-04-28: qty 9

## 2012-04-28 MED ORDER — MORPHINE SULFATE (PF) 1 MG/ML IV SOLN
INTRAVENOUS | Status: DC
  Administered 2012-04-28: 16:00:00 via INTRAVENOUS

## 2012-04-28 MED ORDER — HYDROXYCHLOROQUINE SULFATE 200 MG PO TABS
200.0000 mg | ORAL_TABLET | Freq: Two times a day (BID) | ORAL | Status: DC
  Administered 2012-04-28 – 2012-04-30 (×4): 200 mg via ORAL
  Filled 2012-04-28 (×5): qty 1

## 2012-04-28 MED ORDER — KETAMINE HCL 10 MG/ML IJ SOLN
INTRAMUSCULAR | Status: DC | PRN
  Administered 2012-04-28 (×4): 5 mg via INTRAVENOUS
  Administered 2012-04-28: 20 mg via INTRAVENOUS
  Administered 2012-04-28: 10 mg via INTRAVENOUS

## 2012-04-28 MED ORDER — DIPHENHYDRAMINE HCL 12.5 MG/5ML PO ELIX: 12.5000 mg | ORAL_SOLUTION | ORAL | Status: DC | PRN

## 2012-04-28 MED ORDER — HETASTARCH-ELECTROLYTES 6 % IV SOLN
INTRAVENOUS | Status: DC | PRN
  Administered 2012-04-28: 13:00:00 via INTRAVENOUS

## 2012-04-28 MED ORDER — OXYCODONE HCL 5 MG PO TABS
5.0000 mg | ORAL_TABLET | ORAL | Status: DC | PRN
  Administered 2012-04-30 (×2): 5 mg via ORAL
  Filled 2012-04-28 (×2): qty 1

## 2012-04-28 MED ORDER — ASPIRIN EC 325 MG PO TBEC
325.0000 mg | DELAYED_RELEASE_TABLET | Freq: Two times a day (BID) | ORAL | Status: DC
  Administered 2012-04-28 – 2012-04-30 (×4): 325 mg via ORAL
  Filled 2012-04-28 (×7): qty 1

## 2012-04-28 MED ORDER — LACTATED RINGERS IV SOLN
INTRAVENOUS | Status: DC
  Administered 2012-04-28: 1000 mL via INTRAVENOUS
  Administered 2012-04-28 (×2): via INTRAVENOUS

## 2012-04-28 MED ORDER — CALCIUM CARBONATE-VITAMIN D 600-400 MG-UNIT PO TABS: 1.0000 | ORAL_TABLET | Freq: Two times a day (BID) | ORAL | Status: DC

## 2012-04-28 MED ORDER — ALUM & MAG HYDROXIDE-SIMETH 200-200-20 MG/5ML PO SUSP: 30.0000 mL | ORAL | Status: DC | PRN

## 2012-04-28 MED ORDER — MIDAZOLAM HCL 5 MG/5ML IJ SOLN
INTRAMUSCULAR | Status: DC | PRN
  Administered 2012-04-28: 2 mg via INTRAVENOUS

## 2012-04-28 MED ORDER — DIPHENHYDRAMINE HCL 50 MG/ML IJ SOLN
INTRAMUSCULAR | Status: DC | PRN
  Administered 2012-04-28: 12.5 mg via INTRAVENOUS

## 2012-04-28 MED ORDER — DIPHENHYDRAMINE HCL 12.5 MG/5ML PO ELIX
12.5000 mg | ORAL_SOLUTION | Freq: Four times a day (QID) | ORAL | Status: DC | PRN
  Filled 2012-04-28: qty 5

## 2012-04-28 MED ORDER — SODIUM CHLORIDE 0.9 % IJ SOLN: 9.0000 mL | INTRAMUSCULAR | Status: DC | PRN

## 2012-04-28 MED ORDER — FOLIC ACID 1 MG PO TABS
1.0000 mg | ORAL_TABLET | Freq: Two times a day (BID) | ORAL | Status: DC
  Administered 2012-04-28 – 2012-04-30 (×4): 1 mg via ORAL
  Filled 2012-04-28 (×5): qty 1

## 2012-04-28 MED ORDER — METHOCARBAMOL 500 MG PO TABS
500.0000 mg | ORAL_TABLET | Freq: Four times a day (QID) | ORAL | Status: DC | PRN
  Administered 2012-04-28 – 2012-04-30 (×2): 500 mg via ORAL
  Filled 2012-04-28 (×2): qty 1

## 2012-04-28 MED ORDER — GLYCOPYRROLATE 0.2 MG/ML IJ SOLN
INTRAMUSCULAR | Status: DC | PRN
  Administered 2012-04-28: 0.4 mg via INTRAVENOUS

## 2012-04-28 MED ORDER — CALCIUM CARBONATE-VITAMIN D 500-200 MG-UNIT PO TABS
1.0000 | ORAL_TABLET | Freq: Two times a day (BID) | ORAL | Status: DC
  Administered 2012-04-28 – 2012-04-29 (×3): 1 via ORAL
  Filled 2012-04-28 (×5): qty 1

## 2012-04-28 MED ORDER — CEFAZOLIN SODIUM-DEXTROSE 2-3 GM-% IV SOLR
2.0000 g | INTRAVENOUS | Status: AC
  Administered 2012-04-28: 2 g via INTRAVENOUS

## 2012-04-28 MED ORDER — KETOROLAC TROMETHAMINE 15 MG/ML IJ SOLN
15.0000 mg | Freq: Four times a day (QID) | INTRAMUSCULAR | Status: AC
  Administered 2012-04-29 (×3): 15 mg via INTRAVENOUS
  Filled 2012-04-28 (×3): qty 1

## 2012-04-28 MED ORDER — PROPOFOL 10 MG/ML IV EMUL
INTRAVENOUS | Status: DC | PRN
  Administered 2012-04-28: 100 mg via INTRAVENOUS

## 2012-04-28 MED ORDER — LIDOCAINE HCL (CARDIAC) 20 MG/ML IV SOLN
INTRAVENOUS | Status: DC | PRN
  Administered 2012-04-28: 30 mg via INTRAVENOUS

## 2012-04-28 MED ORDER — HYDROMORPHONE HCL PF 1 MG/ML IJ SOLN
0.2500 mg | INTRAMUSCULAR | Status: DC | PRN
  Administered 2012-04-28 (×2): 0.5 mg via INTRAVENOUS

## 2012-04-28 SURGICAL SUPPLY — 37 items
BAG SPEC THK2 15X12 ZIP CLS (MISCELLANEOUS) ×2
BAG ZIPLOCK 12X15 (MISCELLANEOUS) ×4 IMPLANT
BLADE SAW SGTL 18X1.27X75 (BLADE) ×2 IMPLANT
CELLS DAT CNTRL 66122 CELL SVR (MISCELLANEOUS) ×1 IMPLANT
CLOTH BEACON ORANGE TIMEOUT ST (SAFETY) ×2 IMPLANT
DRAPE C-ARM 42X72 X-RAY (DRAPES) ×2 IMPLANT
DRAPE STERI IOBAN 125X83 (DRAPES) ×2 IMPLANT
DRAPE U-SHAPE 47X51 STRL (DRAPES) ×6 IMPLANT
DRSG MEPILEX BORDER 4X8 (GAUZE/BANDAGES/DRESSINGS) ×2 IMPLANT
DURAPREP 26ML APPLICATOR (WOUND CARE) ×2 IMPLANT
ELECT BLADE TIP CTD 4 INCH (ELECTRODE) ×2 IMPLANT
ELECT REM PT RETURN 9FT ADLT (ELECTROSURGICAL) ×2
ELECTRODE REM PT RTRN 9FT ADLT (ELECTROSURGICAL) ×1 IMPLANT
FACESHIELD LNG OPTICON STERILE (SAFETY) ×8 IMPLANT
GAUZE XEROFORM 1X8 LF (GAUZE/BANDAGES/DRESSINGS) ×2 IMPLANT
GLOVE BIO SURGEON STRL SZ7 (GLOVE) ×2 IMPLANT
GLOVE BIO SURGEON STRL SZ7.5 (GLOVE) ×2 IMPLANT
GLOVE BIOGEL PI IND STRL 7.5 (GLOVE) IMPLANT
GLOVE BIOGEL PI IND STRL 8 (GLOVE) ×1 IMPLANT
GLOVE BIOGEL PI INDICATOR 7.5 (GLOVE)
GLOVE BIOGEL PI INDICATOR 8 (GLOVE) ×1
GLOVE ECLIPSE 7.0 STRL STRAW (GLOVE) ×2 IMPLANT
GOWN STRL REIN XL XLG (GOWN DISPOSABLE) ×8 IMPLANT
KIT BASIN OR (CUSTOM PROCEDURE TRAY) ×2 IMPLANT
LABEL STERILE EO BLANK 1X3 WHT (LABEL) ×2 IMPLANT
PACK TOTAL JOINT (CUSTOM PROCEDURE TRAY) ×2 IMPLANT
PADDING CAST COTTON 6X4 STRL (CAST SUPPLIES) ×2 IMPLANT
RTRCTR WOUND ALEXIS 18CM MED (MISCELLANEOUS) ×2
STAPLER VISISTAT 35W (STAPLE) IMPLANT
SUT ETHIBOND NAB CT1 #1 30IN (SUTURE) ×2 IMPLANT
SUT VIC AB 1 CT1 36 (SUTURE) ×2 IMPLANT
SUT VIC AB 2-0 CT1 27 (SUTURE) ×4
SUT VIC AB 2-0 CT1 TAPERPNT 27 (SUTURE) ×2 IMPLANT
SUT VLOC 180 0 24IN GS25 (SUTURE) ×2 IMPLANT
TOWEL OR 17X26 10 PK STRL BLUE (TOWEL DISPOSABLE) ×4 IMPLANT
TOWEL OR NON WOVEN STRL DISP B (DISPOSABLE) ×2 IMPLANT
TRAY FOLEY CATH 14FRSI W/METER (CATHETERS) ×2 IMPLANT

## 2012-04-28 NOTE — Anesthesia Preprocedure Evaluation (Signed)
Anesthesia Evaluation  Patient identified by MRN, date of birth, ID band Patient awake    Reviewed: Allergy & Precautions, H&P , NPO status , Patient's Chart, lab work & pertinent test results  Airway Mallampati: II TM Distance: >3 FB Neck ROM: full    Dental No notable dental hx. (+) Teeth Intact and Dental Advisory Given   Pulmonary neg pulmonary ROS,  breath sounds clear to auscultation  Pulmonary exam normal       Cardiovascular Exercise Tolerance: Good negative cardio ROS  Rhythm:regular Rate:Normal     Neuro/Psych negative neurological ROS  negative psych ROS   GI/Hepatic negative GI ROS, Neg liver ROS,   Endo/Other  negative endocrine ROS  Renal/GU negative Renal ROS  negative genitourinary   Musculoskeletal  (+) Arthritis -, Rheumatoid disorders,    Abdominal   Peds  Hematology negative hematology ROS (+)   Anesthesia Other Findings   Reproductive/Obstetrics negative OB ROS                           Anesthesia Physical Anesthesia Plan  ASA: II  Anesthesia Plan: General   Post-op Pain Management:    Induction: Intravenous  Airway Management Planned: Oral ETT  Additional Equipment:   Intra-op Plan:   Post-operative Plan: Extubation in OR  Informed Consent: I have reviewed the patients History and Physical, chart, labs and discussed the procedure including the risks, benefits and alternatives for the proposed anesthesia with the patient or authorized representative who has indicated his/her understanding and acceptance.   Dental Advisory Given  Plan Discussed with: CRNA and Surgeon  Anesthesia Plan Comments:         Anesthesia Quick Evaluation

## 2012-04-28 NOTE — Transfer of Care (Signed)
Immediate Anesthesia Transfer of Care Note  Patient: [name] [name]  Procedure(s) Performed: Procedure(s) (LRB): TOTAL HIP ARTHROPLASTY ANTERIOR APPROACH (Right)  Patient Location: PACU  Anesthesia Type: General  Level of Consciousness: sedated and responds to stimulation  Airway & Oxygen Therapy: Patient Spontanous Breathing and Patient connected to face mask oxygen  Post-op Assessment: Report given to PACU RN, Post -op Vital signs reviewed and stable and Patient moving all extremities  Post vital signs: Reviewed and stable  Complications: No apparent anesthesia complications

## 2012-04-28 NOTE — Progress Notes (Signed)
Pt has finished taking all of the antibiotic for UTI

## 2012-04-28 NOTE — Anesthesia Procedure Notes (Signed)
Procedure Name: Intubation Date/Time: 04/28/2012 1:18 PM Performed by: Randon Goldsmith CATHERINE PAYNE Pre-anesthesia Checklist: Emergency Drugs available, Patient identified, Suction available and Patient being monitored Patient Re-evaluated:Patient Re-evaluated prior to inductionOxygen Delivery Method: Circle system utilized Preoxygenation: Pre-oxygenation with 100% oxygen Intubation Type: IV induction Ventilation: Mask ventilation without difficulty Laryngoscope Size: Mac and 3 Grade View: Grade I Tube type: Oral Tube size: 7.0 mm Number of attempts: 1 Placement Confirmation: ETT inserted through vocal cords under direct vision,  positive ETCO2 and CO2 detector Secured at: 20 cm Tube secured with: Tape Dental Injury: Teeth and Oropharynx as per pre-operative assessment

## 2012-04-28 NOTE — Preoperative (Signed)
Beta Blockers   Reason not to administer Beta Blockers:Not Applicable, not on home bb 

## 2012-04-28 NOTE — Brief Op Note (Signed)
04/28/2012  3:12 PM  PATIENT:  [name] [name]  26 y.o. female  PRE-OPERATIVE DIAGNOSIS:  Severe rheumatoid arthritis right hip  POST-OPERATIVE DIAGNOSIS:  Severe rheumatoid arthritis right hip  PROCEDURE:  Procedure(s) (LRB): TOTAL HIP ARTHROPLASTY ANTERIOR APPROACH (Right)  SURGEON:  Surgeon(s) and Role:    * Kathryne Hitch, MD - Primary  PHYSICIAN ASSISTANT:   ASSISTANTS: none   ANESTHESIA:   general  EBL:  Total I/O In: 1500 [I.V.:1000; IV Piggyback:500] Out: 700 [Urine:175; Blood:525]  BLOOD ADMINISTERED:none  DRAINS: none   LOCAL MEDICATIONS USED:  NONE  SPECIMEN:  No Specimen  DISPOSITION OF SPECIMEN:  N/A  COUNTS:  YES  TOURNIQUET:  * No tourniquets in log *  DICTATION: .Other Dictation: Dictation Number [id number]  PLAN OF CARE: Admit to inpatient   PATIENT DISPOSITION:  PACU - hemodynamically stable.   Delay start of Pharmacological VTE agent (>24hrs) due to surgical blood loss or risk of bleeding: no

## 2012-04-28 NOTE — H&P (Signed)
[name] [name] is an 26 y.o. female.   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  Her mobility is greatly limited and her pain is severe.  She wishes to proceed with a total hip replacement.  I spoken to her in length about this as well as have consulted with other joint specialists who feel that this is her only option given the failure of conservative treatment.  The risks include infection, blood loss, nerve injury and failure with time of the implant.  The goals are decreased pain and improved function.  Past Medical History  Diagnosis Date  . Arthritis     RA - DX'D AT AGE 26--PT HAVING SEVERE PAIN IN BOTH HIPS==PLANS RT HIP REPLACEMENT FIRST.  Marland Kitchen GERD (gastroesophageal reflux disease)     NO RECENT PROBLEMS--HAS LEARNED TO CONTROL WITH DIET  . Depression   . Headache     SHOOTING PAINS INSIDE OF HEAD--STATES SHE HAS HAD TESTING WHICH WAS NEGATIVE FOR ANY PROBLEMS.   NO RECENT HEAD PAINS  . Anemia     AND VIT B 12 DEFICIENCY--TAKES IRON PILL AS NEEDED  . IBS (irritable bowel syndrome)   . Scoliosis   . 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 Chronic night sweats     Past Surgical History  Procedure Date  . Nasal septum surgery 02/28/2011  . Wisdom teeth extracted   . Left thumb surgery ate 3 after injury to the thumb     History reviewed. No pertinent family history. Social History:  reports that she has never smoked. She has never used smokeless tobacco. She reports that she does not drink alcohol or use illicit drugs.  Allergies:  Allergies  Allergen Reactions  . Other     LACTOSE INTOLERANCE--GI UPSET    Medications Prior to Admission  Medication Sig Dispense Refill  . Calcium Carbonate-Vitamin D (CALTRATE 600+D) 600-400 MG-UNIT per tablet Take 1 tablet by mouth 2 (two) times daily.       .  DULoxetine (CYMBALTA) 30 MG capsule Take 30 mg by mouth 2 (two) times daily.      . folic acid (FOLVITE) 1 MG tablet Take 1 mg by mouth 2 (two) times daily.      Marland Kitchen HYDROcodone-acetaminophen (VICODIN) 5-500 MG per tablet Take 1 tablet by mouth every 4 (four) hours as needed. For pain      . hydroxychloroquine (PLAQUENIL) 200 MG tablet Take 200 mg by mouth 2 (two) times daily.      Marland Kitchen adalimumab (HUMIRA) 40 MG/0.8ML injection Inject 40 mg into the skin every 7 (seven) days. On Saturdays      . ferrous sulfate 325 (65 FE) MG tablet Take 325 mg by mouth every other day as needed. Patient takes as needed.      . methotrexate (RHEUMATREX) 2.5 MG tablet Take 20 mg by mouth once a week. On Saturdays        No results found for this or any previous visit (from the past 48 hour(s)). No results found.  Review of Systems  All other systems reviewed and are negative.    Blood pressure 109/64, pulse 89, temperature 98.9 F (37.2 C), resp. rate 20, SpO2 100.00%. Physical Exam  Constitutional: She is oriented to person, place, and time. She appears well-developed and  well-nourished.  HENT:  Head: Normocephalic and atraumatic.  Eyes: EOM are normal. Pupils are equal, round, and reactive to light.  Neck: Normal range of motion. Neck supple.  Cardiovascular: Normal rate and regular rhythm.   Respiratory: Effort normal and breath sounds normal.  GI: Soft. Bowel sounds are normal.  Musculoskeletal:       Right hip: She exhibits decreased range of motion, bony tenderness and crepitus.  Neurological: She is alert and oriented to person, place, and time.  Skin: Skin is warm and dry.  Psychiatric: She has a normal mood and affect.     Assessment/Plan To the OR for a right total hip replacement.  Kathryne Hitch 04/28/2012, 12:51 PM

## 2012-04-28 NOTE — Anesthesia Postprocedure Evaluation (Signed)
  Anesthesia Post-op Note  Patient: [name] [name]  Procedure(s) Performed: Procedure(s) (LRB): TOTAL HIP ARTHROPLASTY ANTERIOR APPROACH (Right)  Patient Location: PACU  Anesthesia Type: General  Level of Consciousness: awake and alert   Airway and Oxygen Therapy: Patient Spontanous Breathing  Post-op Pain: mild  Post-op Assessment: Post-op Vital signs reviewed, Patient's Cardiovascular Status Stable, Respiratory Function Stable, Patent Airway and No signs of Nausea or vomiting  Post-op Vital Signs: stable  Complications: No apparent anesthesia complications 

## 2012-04-29 LAB — BASIC METABOLIC PANEL
CO2: 24 mEq/L (ref 19–32)
Calcium: 7.8 mg/dL — ABNORMAL LOW (ref 8.4–10.5)
Glucose, Bld: 119 mg/dL — ABNORMAL HIGH (ref 70–99)
Potassium: 4 mEq/L (ref 3.5–5.1)
Sodium: 136 mEq/L (ref 135–145)

## 2012-04-29 LAB — CBC
HCT: 28.5 % — ABNORMAL LOW (ref 36.0–46.0)
Hemoglobin: 9.4 g/dL — ABNORMAL LOW (ref 12.0–15.0)
MCH: 30.4 pg (ref 26.0–34.0)
RBC: 3.09 MIL/uL — ABNORMAL LOW (ref 3.87–5.11)

## 2012-04-29 NOTE — Progress Notes (Signed)
Physical Therapy Treatment Patient Details Name: [name] [name] MRN: [id number] DOB: 1986/06/17 Today's Date: 04/29/2012 Time: 4098-1191 PT Time Calculation (min): 16 min  PT Assessment / Plan / Recommendation Comments on Treatment Session       Follow Up Recommendations  Home health PT    Barriers to Discharge        Equipment Recommendations  None recommended by PT    Recommendations for Other Services OT consult  Frequency 7X/week   Plan Discharge plan remains appropriate    Precautions / Restrictions Precautions Precautions: None Restrictions Weight Bearing Restrictions: No Other Position/Activity Restrictions: WBAT   Pertinent Vitals/Pain "It doesn't hurt any worse than before surgery"    Mobility  Bed Mobility Bed Mobility: Sit to Supine Sit to Supine: 4: Min assist Details for Bed Mobility Assistance: min assist with R LE Transfers Transfers: Sit to Stand;Stand to Sit Sit to Stand: 4: Min guard Stand to Sit: 4: Min guard Details for Transfer Assistance: min cues for use of UEs Ambulation/Gait Ambulation/Gait Assistance: 4: Min assist Ambulation Distance (Feet): 190 Feet (x2) Assistive device: 4-wheeled walker Ambulation/Gait Assistance Details: min cues for position from RW; several standing rests 2* UE fatigue Gait Pattern: Step-to pattern;Step-through pattern    Exercises     PT Diagnosis:    PT Problem List:   PT Treatment Interventions:     PT Goals Acute Rehab PT Goals PT Goal Formulation: With patient Time For Goal Achievement: 05/04/12 Potential to Achieve Goals: Good Pt will go Supine/Side to Sit: with supervision PT Goal: Supine/Side to Sit - Progress: Goal set today Pt will go Sit to Supine/Side: with supervision PT Goal: Sit to Supine/Side - Progress: Goal set today Pt will go Sit to Stand: with supervision PT Goal: Sit to Stand - Progress: Progressing toward goal Pt will go Stand to Sit: with supervision PT Goal: Stand to Sit -  Progress: Progressing toward goal Pt will Ambulate: >150 feet;with supervision;with rolling walker PT Goal: Ambulate - Progress: Progressing toward goal  Visit Information  Last PT Received On: 04/29/12 Assistance Needed: +1    Subjective Data  Subjective: look I can stand on just my operated leg Patient Stated Goal: be able to tie my own shoes   Cognition  Overall Cognitive Status: Appears within functional limits for tasks assessed/performed Arousal/Alertness: Awake/alert Orientation Level: Appears intact for tasks assessed Behavior During Session: [facility] for tasks performed    Balance     End of Session PT - End of Session Activity Tolerance: Patient tolerated treatment well Patient left: with call bell/phone within reach;with family/visitor present;in bed Nurse Communication: Mobility status   GP     Orazio Weller 04/29/2012, 3:16 PM

## 2012-04-29 NOTE — Progress Notes (Signed)
Physical Therapy Treatment Patient Details Name: [name] [name] MRN: [id number] DOB: 1986/10/16 Today's Date: 04/29/2012 Time: 8295-6213 PT Time Calculation (min): 33 min  PT Assessment / Plan / Recommendation Comments on Treatment Session       Follow Up Recommendations  Home health PT    Barriers to Discharge        Equipment Recommendations  None recommended by PT    Recommendations for Other Services OT consult  Frequency 7X/week   Plan      Precautions / Restrictions Precautions Precautions: None Restrictions Weight Bearing Restrictions: No Other Position/Activity Restrictions: WBAT   Pertinent Vitals/Pain It hurts the same as before surgery so I don't need any pain medicine    Mobility  Bed Mobility Bed Mobility: Supine to Sit Supine to Sit: 4: Min assist Details for Bed Mobility Assistance: Pt able to transfer to EOB on L with min assist and use of rail but struggles with attempt to sit on R side Transfers Transfers: Sit to Stand;Stand to Sit Sit to Stand: 4: Min assist Stand to Sit: 4: Min assist Details for Transfer Assistance: min cues for use of UEs Ambulation/Gait Ambulation/Gait Assistance: 4: Min assist Ambulation Distance (Feet): 111 Feet Assistive device: Rolling walker Ambulation/Gait Assistance Details: min assist with cues for posture and position from RW Gait Pattern: Step-to pattern;Step-through pattern    Exercises Total Joint Exercises Ankle Circles/Pumps: AROM;20 reps;Supine;Both Quad Sets: AROM;Both;10 reps;Supine Heel Slides: Right;15 reps;Supine;AAROM Hip ABduction/ADduction: AAROM;15 reps;Supine;Right   PT Diagnosis: Difficulty walking  PT Problem List: Decreased strength;Decreased range of motion;Decreased activity tolerance;Decreased mobility;Decreased knowledge of use of DME;Pain PT Treatment Interventions: DME instruction;Gait training;Stair training;Functional mobility training;Therapeutic activities;Therapeutic  exercise;Patient/family education   PT Goals Acute Rehab PT Goals PT Goal Formulation: With patient Time For Goal Achievement: 05/04/12 Potential to Achieve Goals: Good Pt will go Supine/Side to Sit: with supervision PT Goal: Supine/Side to Sit - Progress: Goal set today Pt will go Sit to Supine/Side: with supervision PT Goal: Sit to Supine/Side - Progress: Goal set today Pt will go Sit to Stand: with supervision PT Goal: Sit to Stand - Progress: Goal set today Pt will go Stand to Sit: with supervision PT Goal: Stand to Sit - Progress: Goal set today Pt will Ambulate: >150 feet;with supervision;with rolling walker PT Goal: Ambulate - Progress: Goal set today  Visit Information  Last PT Received On: 04/29/12 Assistance Needed: +1    Subjective Data  Subjective: I haven't been taking any pain meds because it hurts about the same as before the surgery Patient Stated Goal: be able to tie my own shoes   Cognition  Overall Cognitive Status: Appears within functional limits for tasks assessed/performed Arousal/Alertness: Awake/alert Orientation Level: Appears intact for tasks assessed Behavior During Session: [facility] for tasks performed    Balance     End of Session PT - End of Session Activity Tolerance: Patient tolerated treatment well Patient left: in chair;with call bell/phone within reach;with family/visitor present Nurse Communication: Mobility status   GP     Kayzen Kendzierski 04/29/2012, 12:33 PM

## 2012-04-29 NOTE — Progress Notes (Signed)
Subjective: Pt stable - pain controlled   Objective: Vital signs in last 24 hours: Temp:  [97.2 F (36.2 C)-98.9 F (37.2 C)] 98.4 F (36.9 C) (07/06 0629) Pulse Rate:  [87-121] 87  (07/06 0629) Resp:  [9-20] 19  (07/06 0800) BP: (100-141)/(45-81) 106/66 mmHg (07/06 0629) SpO2:  [98 %-100 %] 100 % (07/06 0800) Weight:  [55.339 kg (122 lb)] 55.339 kg (122 lb) (07/05 1645)  Intake/Output from previous day: 07/05 0701 - 07/06 0700 In: 3753.5 [P.O.:240; I.V.:2908.5; IV Piggyback:605] Out: 2150 [Urine:1625; Blood:525] Intake/Output this shift:    Exam:  Neurovascular intact Sensation intact distally Intact pulses distally Dorsiflexion/Plantar flexion intact  Labs:  Basename 04/29/12 0449  HGB 9.4*    Basename 04/29/12 0449  WBC 7.9  RBC 3.09*  HCT 28.5*  PLT 271    Basename 04/29/12 0449  NA 136  K 4.0  CL 106  CO2 24  BUN 10  CREATININE 0.54  GLUCOSE 119*  CALCIUM 7.8*   No results found for this basename: LABPT:2,INR:2 in the last 72 hours  Assessment/Plan: Pt appears very comfortable - mobilize with PT   Stephania Macfarlane [name] 04/29/2012, 9:39 AM

## 2012-04-29 NOTE — Op Note (Signed)
NAMEHEAVAN, [name] NO.:  [id number]  MEDICAL RECORD NO.:  [id number]  LOCATION:  1615                         FACILITY:  [facility]  PHYSICIAN:  Vanita Panda. Magnus Ivan, M.D.DATE OF BIRTH:  [birth year]  DATE OF PROCEDURE:  04/28/2012 DATE OF DISCHARGE:                              OPERATIVE REPORT   PREOPERATIVE DIAGNOSES:  Severe right degenerative joint disease, insulin with rheumatoid arthritis, and severe hip protrusio.  POSTOPERATIVE DIAGNOSES:  Severe right degenerative joint disease, insulin with rheumatoid arthritis, and severe hip protrusio.  PROCEDURE:  Right total hip arthroplasty.  IMPLANTS:  DePuy Sector Gription acetabular component size 48, 32+ 4 neutral polyethylene liner, Tri-Lock femoral component, size 0 with standard offset, size 32+ 1 ceramic hip ball.  SURGEON:  Vanita Panda. Magnus Ivan, MD  ANESTHESIA:  General.  ANTIBIOTICS:  2 g of Ancef.  BLOOD LOSS:  525 mL.  COMPLICATIONS:  None.  INDICATIONS:  [name] [name] is a 27 year old female with severe rheumatoid arthritis.  She has severe disease in both of her hips with protrusio of the hip balls within the socket.  Her pain is daily and severe.  Her mobility is greatly limited, and at this point, she wished to proceed with a total hip arthroplasty given the failure of conservative treatment.  I have talked to her about the difficulty given her protrusio and her age as well as the minimal offset she has had her hips, but it felt like this would be accomplished with a direct anterior approach and I did seek outside consultation from other joint surgeons with my plan.  The risks and benefits of this were explained to her in detail and she did wish to proceed with surgery.  PROCEDURE DESCRIPTION:  After informed consent was obtained, appropriate right hip was marked.  She was brought to the operating room, general anesthesia was obtained while she was on the stretcher and a  Foley catheter was placed and then both feet were placed in traction boots. She was then placed supine on the Hana table with perineal post in place supine and both legs were placed in in-line skeletal traction with no traction applied.  Under direct fluoroscopy, I could assess the center so I could get her leg length as equal as possible and assessment of the hip center.  I then prepped the right hip with DuraPrep and sterile drapes.  Time-out was called and she identified as correct patient and correct right hip.  I then made an incision just inferior and posterior to the anterior superior iliac spine and carried obliquely down the leg. I dissected down to the tensor fascia lata, and the tensor fascia was divided longitudinally.  I then proceeded with a direct anterior approach to the hip.  A Cobra retractor was placed around the lateral neck, and then up underneath the rectus femoris, the medial retractor was placed.  I coagulated the lateral femoral circumflex vessels with electrocautery and then divided the hip capsule.  The Cobra retractors were placed within the hip capsule.  I was unable to make my femoral neck cut just proximal to the lesser trochanter.  I finished that cut with an osteotome and then I  was able to remove the femoral head in its entirety.  It was devoid of cartilage.  Next, I cleaned the acetabulum and debris and given the fact of this protrusio, I did minimal reaming deep starting with 42 reamer in 2-mm increments all the way up to a 48 with all reamers were placed under direct visualization, and the last reamer placed under direct fluoroscopy to obtain my depth, inclination and abduction.  I then placed the real size 48 acetabular component from DePuy, which is the [employer] component.  We knocked this into place under direct visualization and fluoroscopy, and it was nice and solid.  I placed the apex hole eliminator guide and real 32+ 4  neutral polyethylene liner.  Attention was then turned to the femur where we were able to externally rotate the femur to 90 degrees, extend and adduct the hip to gain access to the femoral canal.  I used a box cutting guide followed by initiating broach to open up the femoral canal.  I released the piriformis as well and then broach was only to the Tri-Lock broach with the 0 broach already filling the canal proximally.  I then trialed a standard neck and a 32+ 1 hip ball of her leg back over and up, and with traction and internal rotation, I was able to reduce the hip.  It was stable with internal and external rotation with minimal shuck.  Her leg lengths did show that this leg was longer; however, with severe disease in her other hip, we will be able to match this with time.  I then dislocated the hip and removed all trial components.  We then placed the DePuy Tri-Lock femoral component size 0 with a standard neck and the real 32+ 1 ceramic hip ball.  We reduced this back into the acetabulum, and it was completely stable.  I then copiously irrigated the soft tissue with normal saline solution and closed the joint capsule with interrupted #1 Ethibond suture.  We closed the tensor fascia lata with a running 0 V-Loc suture followed by 2-0 Vicryl in the subcutaneous tissue and staples on the skin.  Well-padded sterile dressing was applied, and she was awakened, extubated, and taken to the recovery room in stable condition.  All sponge counts were correct.  There were no complications noted.     Vanita Panda. Magnus Ivan, M.D.     CYB/MEDQ  D:  04/28/2012  T:  04/28/2012  Job:  [id number]

## 2012-04-30 LAB — CBC
Hemoglobin: 8.7 g/dL — ABNORMAL LOW (ref 12.0–15.0)
MCH: 31.1 pg (ref 26.0–34.0)
MCV: 93.9 fL (ref 78.0–100.0)
RBC: 2.8 MIL/uL — ABNORMAL LOW (ref 3.87–5.11)

## 2012-04-30 MED ORDER — CALCIUM CARBONATE-VITAMIN D 500-200 MG-UNIT PO TABS
2.0000 | ORAL_TABLET | Freq: Two times a day (BID) | ORAL | Status: DC
  Administered 2012-04-30: 2 via ORAL
  Filled 2012-04-30 (×2): qty 2

## 2012-04-30 MED ORDER — HYDROCODONE-ACETAMINOPHEN 5-500 MG PO TABS: 1.0000 | ORAL_TABLET | ORAL | Status: DC | PRN

## 2012-04-30 MED ORDER — ASPIRIN 325 MG PO TBEC: 325.0000 mg | DELAYED_RELEASE_TABLET | Freq: Two times a day (BID) | ORAL | Status: AC

## 2012-04-30 MED ORDER — METHOCARBAMOL 500 MG PO TABS: 500.0000 mg | ORAL_TABLET | Freq: Four times a day (QID) | ORAL | Status: AC | PRN

## 2012-04-30 NOTE — Progress Notes (Signed)
Physical Therapy Treatment Patient Details Name: [name] [name] MRN: [id number] DOB: 1986-09-17 Today's Date: 04/30/2012 Time: 4098-1191 PT Time Calculation (min): 14 min  PT Assessment / Plan / Recommendation Comments on Treatment Session  Pt continues to progress well and is ready for D/C.     Follow Up Recommendations  Home health PT    Barriers to Discharge        Equipment Recommendations  None recommended by OT    Recommendations for Other Services    Frequency 7X/week   Plan Discharge plan remains appropriate    Precautions / Restrictions Precautions Precautions: None Restrictions Weight Bearing Restrictions: No Other Position/Activity Restrictions: WBAT   Pertinent Vitals/Pain 5/10    Mobility  Bed Mobility Bed Mobility: Sit to Supine Supine to Sit: 5: Supervision Sit to Supine: 4: Min assist Details for Bed Mobility Assistance: very min assist for RLE into bed.  Transfers Transfers: Sit to Stand;Stand to Sit Sit to Stand: 6: Modified independent (Device/Increase time);With upper extremity assist;From bed Stand to Sit: 6: Modified independent (Device/Increase time);With upper extremity assist;To bed Details for Transfer Assistance: Cues for use of UEs and for safety.  Ambulation/Gait Ambulation/Gait Assistance: 5: Supervision Ambulation Distance (Feet): 400 Feet Assistive device: 4-wheeled walker Ambulation/Gait Assistance Details: Min cues for upright posture to preven back pain Gait Pattern: Step-to pattern;Step-through pattern Gait velocity: decreased    Exercises Total Joint Exercises Hip ABduction/ADduction: AROM;10 reps;Standing Knee Flexion: AROM;Right;10 reps;Standing Marching in Standing: AROM;10 reps;Standing   PT Diagnosis:    PT Problem List:   PT Treatment Interventions:     PT Goals Acute Rehab PT Goals PT Goal Formulation: With patient Time For Goal Achievement: 05/04/12 Potential to Achieve Goals: Good Pt will go Supine/Side to Sit:  with supervision PT Goal: Supine/Side to Sit - Progress: Met Pt will go Sit to Supine/Side: with supervision PT Goal: Sit to Supine/Side - Progress: Progressing toward goal Pt will go Sit to Stand: with supervision PT Goal: Sit to Stand - Progress: Met Pt will go Stand to Sit: with supervision PT Goal: Stand to Sit - Progress: Met Pt will Ambulate: >150 feet;with supervision;with rolling walker PT Goal: Ambulate - Progress: Met  Visit Information  Last PT Received On: 04/30/12 Assistance Needed: +1    Subjective Data  Subjective: I'm getting bored Patient Stated Goal: be able to tie my own shoes   Cognition  Overall Cognitive Status: Appears within functional limits for tasks assessed/performed Arousal/Alertness: Awake/alert Orientation Level: Appears intact for tasks assessed Behavior During Session: [facility] for tasks performed    Balance     End of Session PT - End of Session Activity Tolerance: Patient tolerated treatment well Patient left: in bed;with call bell/phone within reach;with family/visitor present Nurse Communication: Mobility status   GP     Page, Meribeth Mattes 04/30/2012, 1:03 PM

## 2012-04-30 NOTE — Progress Notes (Signed)
Discharged pt to family auto via personal wc/RW. Assessment unchanged from am

## 2012-04-30 NOTE — Evaluation (Signed)
Occupational Therapy Evaluation Patient Details Name: [name] [name] MRN: [id number] DOB: Nov 15, 1985 Today's Date: 04/30/2012 Time: 7829-5621 OT Time Calculation (min): 16 min  OT Assessment / Plan / Recommendation Clinical Impression  This 26 y.o female with h/o RA admitted for direct approach anterior THA.  Pt. doing well with ADLs.  Pt. has all DME and AE if needed, demonstrates ability to perform LB ADLs with supervision, and will have assist with tub transfer (currently min A).  All education completed, no further OT needed.  Will sign off    OT Assessment  Patient does not need any further OT services    Follow Up Recommendations  No OT follow up;Supervision - Intermittent    Barriers to Discharge      Equipment Recommendations  None recommended by OT    Recommendations for Other Services    Frequency       Precautions / Restrictions Precautions Precautions: None Restrictions Weight Bearing Restrictions: No Other Position/Activity Restrictions: WBAT       ADL  Eating/Feeding: Simulated;Independent Where Assessed - Eating/Feeding: Chair Grooming: Simulated;Wash/dry hands;Wash/dry face;Teeth care;Supervision/safety Where Assessed - Grooming: Supported standing Upper Body Bathing: Simulated;Set up Where Assessed - Upper Body Bathing: Supported sitting Lower Body Bathing: Simulated;Supervision/safety Where Assessed - Lower Body Bathing: Unsupported sit to stand Upper Body Dressing: Simulated;Set up Where Assessed - Upper Body Dressing: Unsupported sitting Lower Body Dressing: Simulated;Performed;Supervision/safety Where Assessed - Lower Body Dressing: Unsupported sit to stand Toilet Transfer: Simulated;Supervision/safety Toilet Transfer Method: Sit to stand Toilet Transfer Equipment: Raised toilet seat with arms (or 3-in-1 over toilet) Toileting - Clothing Manipulation and Hygiene: Simulated;Modified independent Where Assessed - Toileting Clothing Manipulation and  Hygiene: Sit on 3-in-1 or toilet Tub/Shower Transfer: Simulated;Minimal assistance Tub/Shower Transfer Method: Science writer: Shower seat with back Equipment Used: Rolling walker Transfers/Ambulation Related to ADLs: Pt. ambulates in room with supervision ADL Comments: Pt. able to don/doff socks with supervision.  Pt. demonstrated ability to perform tub transfer with min A (stepping into tub).  She reports she has grab bars, and will have physical assist when attempting this. Pt. does not feel she needs further OT    OT Diagnosis:    OT Problem List:   OT Treatment Interventions:     OT Goals    Visit Information  Last OT Received On: 04/30/12 Assistance Needed: +1    Subjective Data  Subjective: "I'm able to move better now than I have in two years" Patient Stated Goal: To go home   Prior Functioning  Home Living Lives With: Other (Comment) Available Help at Discharge: Family Type of Home: Apartment Home Access: Level entry Home Layout: One level Bathroom Shower/Tub: Engineer, manufacturing systems: Standard Bathroom Accessibility: Yes How Accessible: Accessible via walker Home Adaptive Equipment: Long-handled shoehorn;Long-handled sponge;Reacher;Shower chair with back;Sock aid (elevated commode with arms) Additional Comments: Pt. reports she has all AE, but doesn't typically use it Prior Function Level of Independence: Independent Communication Communication: No difficulties Dominant Hand: Right    Cognition  Overall Cognitive Status: Appears within functional limits for tasks assessed/performed Arousal/Alertness: Awake/alert Orientation Level: Appears intact for tasks assessed Behavior During Session: [facility] for tasks performed    Extremity/Trunk Assessment Right Upper Extremity Assessment RUE ROM/Strength/Tone: [facility] for tasks assessed Left Upper Extremity Assessment LUE ROM/Strength/Tone: WFL for tasks assessed   Mobility Bed  Mobility Bed Mobility: Supine to Sit Supine to Sit: 5: Supervision Details for Bed Mobility Assistance: Supervision and cues for safety.  Pt improved with getting RLE off  EOB.  Transfers Transfers: Sit to Stand;Stand to Sit Sit to Stand: 5: Supervision;With upper extremity assist;From bed Stand to Sit: 5: Supervision Details for Transfer Assistance: Cues for use of UEs and for safety.    Exercise Total Joint Exercises Hip ABduction/ADduction: AROM;10 reps;Standing Knee Flexion: AROM;Right;10 reps;Standing Marching in Standing: AROM;10 reps;Standing  Balance    End of Session OT - End of Session Activity Tolerance: Patient tolerated treatment well Patient left: in chair;with call bell/phone within reach  GO     Ferrah Panagopoulos M 04/30/2012, 9:28 AM

## 2012-04-30 NOTE — Progress Notes (Addendum)
Physical Therapy Treatment Patient Details Name: MAHLANI BERNINGER MRN: [id number] DOB: 25-Jan-1986 Today's Date: 04/30/2012 Time: 4132-4401 PT Time Calculation (min): 23 min  PT Assessment / Plan / Recommendation Comments on Treatment Session  Pt progressing well with ambulation with some increased c/o pain with exercises.  Questioned pt about heel lift for shoes due to leg length discrepancy with pt stating that she will be visiting her orthotist following D/C to be fitted for new heel lift.  Pt safe to ambulate in room with mother present.     Follow Up Recommendations  Home health PT    Barriers to Discharge        Equipment Recommendations  None recommended by PT    Recommendations for Other Services    Frequency 7X/week   Plan Discharge plan remains appropriate    Precautions / Restrictions Precautions Precautions: None Restrictions Weight Bearing Restrictions: No Other Position/Activity Restrictions: WBAT   Pertinent Vitals/Pain 8-9/10    Mobility  Bed Mobility Bed Mobility: Supine to Sit Supine to Sit: 5: Supervision Details for Bed Mobility Assistance: Supervision and cues for safety.  Pt improved with getting RLE off EOB.  Transfers Transfers: Sit to Stand;Stand to Sit Sit to Stand: 5: Supervision;With upper extremity assist;From bed Stand to Sit: 5: Supervision Details for Transfer Assistance: Cues for use of UEs and for safety.  Ambulation/Gait Ambulation/Gait Assistance: 4: Min guard Ambulation Distance (Feet): 400 Feet Assistive device: 4-wheeled walker Ambulation/Gait Assistance Details: Min cues for posture.  continues to require several standing rest breaks due to fatigue.  Gait Pattern: Step-to pattern;Step-through pattern Gait velocity: decreased    Exercises Total Joint Exercises Hip ABduction/ADduction: AROM;10 reps;Standing Knee Flexion: AROM;Right;10 reps;Standing Marching in Standing: AROM;10 reps;Standing   PT Diagnosis:    PT Problem List:     PT Treatment Interventions:     PT Goals Acute Rehab PT Goals PT Goal Formulation: With patient Time For Goal Achievement: 05/04/12 Potential to Achieve Goals: Good Pt will go Supine/Side to Sit: with supervision PT Goal: Supine/Side to Sit - Progress: Met Pt will go Sit to Stand: with supervision PT Goal: Sit to Stand - Progress: Met Pt will go Stand to Sit: with supervision PT Goal: Stand to Sit - Progress: Met Pt will Ambulate: >150 feet;with supervision;with rolling walker PT Goal: Ambulate - Progress: Progressing toward goal  Visit Information  Last PT Received On: 04/30/12 Assistance Needed: +1    Subjective Data  Subjective: I don't like to walk with the walker b/c I get tired.  Patient Stated Goal: be able to tie my own shoes   Cognition  Overall Cognitive Status: Appears within functional limits for tasks assessed/performed Arousal/Alertness: Awake/alert Orientation Level: Appears intact for tasks assessed Behavior During Session: [facility] for tasks performed    Balance     End of Session PT - End of Session Activity Tolerance: Patient limited by pain Patient left: in chair;with call bell/phone within reach Nurse Communication: Mobility status   GP     Page, Meribeth Mattes 04/30/2012, 9:13 AM

## 2012-04-30 NOTE — Progress Notes (Signed)
Subjective: Pt stable - post op pain less than pre op pain   Objective: Vital signs in last 24 hours: Temp:  [97.8 F (36.6 C)-99 F (37.2 C)] 98.9 F (37.2 C) (07/07 0526) Pulse Rate:  [89-109] 106  (07/07 0526) Resp:  [14-18] 16  (07/07 0846) BP: (96-105)/(61-69) 105/69 mmHg (07/07 0526) SpO2:  [97 %-100 %] 100 % (07/07 0526)  Intake/Output from previous day: 07/06 0701 - 07/07 0700 In: 2196.3 [P.O.:480; I.V.:1716.3] Out: 1601 [Urine:1600; Stool:1] Intake/Output this shift: Total I/O In: 177.5 [I.V.:177.5] Out: 150 [Urine:150]  Exam:  Neurovascular intact Sensation intact distally Intact pulses distally Dorsiflexion/Plantar flexion intact  Labs:  Basename 04/30/12 0448 04/29/12 0449  HGB 8.7* 9.4*    Basename 04/30/12 0448 04/29/12 0449  WBC 5.4 7.9  RBC 2.80* 3.09*  HCT 26.3* 28.5*  PLT 213 271    Basename 04/29/12 0449  NA 136  K 4.0  CL 106  CO2 24  BUN 10  CREATININE 0.54  GLUCOSE 119*  CALCIUM 7.8*   No results found for this basename: LABPT:2,INR:2 in the last 72 hours  Assessment/Plan: Improving quickly - supplement CA - check hgb am   Jaire Pinkham [name] 04/30/2012, 9:25 AM

## 2012-04-30 NOTE — Discharge Summary (Signed)
Patient ID: [name] [name] MRN: [id number] DOB/AGE: 12-22-1985 26 y.o.  Admit date: 04/28/2012 Discharge date: 04/30/2012  Admission Diagnoses:  Principal Problem:  *Degenerative arthritis of hip   Discharge Diagnoses:  Same  Past Medical History  Diagnosis Date  . Arthritis     RA - DX'D AT AGE 47--PT HAVING SEVERE PAIN IN BOTH HIPS==PLANS RT HIP REPLACEMENT FIRST.  Marland Kitchen GERD (gastroesophageal reflux disease)     NO RECENT PROBLEMS--HAS LEARNED TO CONTROL WITH DIET  . Depression   . Headache     SHOOTING PAINS INSIDE OF HEAD--STATES SHE HAS HAD TESTING WHICH WAS NEGATIVE FOR ANY PROBLEMS.   NO RECENT HEAD PAINS  . Anemia     AND VIT B 12 DEFICIENCY--TAKES IRON PILL AS NEEDED  . IBS (irritable bowel syndrome)   . Scoliosis   . 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 Chronic night sweats     Surgeries: Procedure(s): TOTAL HIP ARTHROPLASTY ANTERIOR APPROACH on 04/28/2012   Consultants:    Discharged Condition: Improved  Hospital Course: [name] [name] is an 26 y.o. female who was admitted 04/28/2012 for operative treatment ofDegenerative arthritis of hip. Patient has severe unremitting pain that affects sleep, daily activities, and work/hobbies. After pre-op clearance the patient was taken to the operating room on 04/28/2012 and underwent  Procedure(s): TOTAL HIP ARTHROPLASTY ANTERIOR APPROACH.    Patient was given perioperative antibiotics: Anti-infectives     Start     Dose/Rate Route Frequency Ordered Stop   04/28/12 2200   hydroxychloroquine (PLAQUENIL) tablet 200 mg        200 mg Oral 2 times daily 04/28/12 1654     04/28/12 1930   ceFAZolin (ANCEF) IVPB 1 g/50 mL premix        1 g 100 mL/hr over 30 Minutes Intravenous Every 6 hours 04/28/12 1654 05-21-12 0229   04/28/12 1012   ceFAZolin (ANCEF) IVPB 2 g/50 mL premix        2 g 100 mL/hr over 30 Minutes Intravenous 60 min pre-op 04/28/12 1012 04/28/12 1324            Patient was given sequential compression devices, early ambulation, and chemoprophylaxis to prevent DVT.  Patient benefited maximally from hospital stay and there were no complications.    Recent vital signs: Patient Vitals for the past 24 hrs:  BP Temp Temp src Pulse Resp SpO2  04/30/12 0846 - - - - 16  -  04/30/12 0526 105/69 mmHg 98.9 F (37.2 C) Oral 106  18  100 %  04/30/12 0434 - - - - 14  97 %  05/21/2012 2353 - - - - 16  97 %  May 21, 2012 2144 96/61 mmHg 99 F (37.2 C) Oral 109  18  99 %  05-21-2012 2038 - - - - 18  97 %  May 21, 2012 1600 - - - - 18  100 %  05/21/12 1420 103/65 mmHg 98.2 F (36.8 C) Oral 89  16  100 %     Recent laboratory studies:  Basename 04/30/12 0448 05/21/2012 0449  WBC 5.4 7.9  HGB 8.7* 9.4*  HCT 26.3* 28.5*  PLT 213 271  NA -- 136  K -- 4.0  CL -- 106  CO2 -- 24  BUN -- 10  CREATININE -- 0.54  GLUCOSE -- 119*  INR -- --  CALCIUM -- 7.8*     Discharge Medications:   Medication List  As of 04/30/2012  1:16 PM   TAKE these medications         aspirin 325 MG EC tablet   Take 1 tablet (325 mg total) by mouth 2 (two) times daily.      CALTRATE 600+D 600-400 MG-UNIT per tablet   Generic drug: Calcium Carbonate-Vitamin D   Take 1 tablet by mouth 2 (two) times daily.      DULoxetine 30 MG capsule   Commonly known as: CYMBALTA   Take 30 mg by mouth 2 (two) times daily.      ferrous sulfate 325 (65 FE) MG tablet   Take 325 mg by mouth every other day as needed. Patient takes as needed.      folic acid 1 MG tablet   Commonly known as: FOLVITE   Take 1 mg by mouth 2 (two) times daily.      HUMIRA 40 MG/0.8ML injection   Generic drug: adalimumab   Inject 40 mg into the skin every 7 (seven) days. On Saturdays      HYDROcodone-acetaminophen 5-500 MG per tablet   Commonly known as: VICODIN   Take 1 tablet by mouth every 4 (four) hours as needed. For pain      hydroxychloroquine 200 MG tablet   Commonly known as: PLAQUENIL   Take 200 mg by  mouth 2 (two) times daily.      methocarbamol 500 MG tablet   Commonly known as: ROBAXIN   Take 1 tablet (500 mg total) by mouth every 6 (six) hours as needed.      methotrexate 2.5 MG tablet   Commonly known as: RHEUMATREX   Take 20 mg by mouth once a week. On Saturdays            Diagnostic Studies: Dg Hip Complete Right  04/28/2012  *RADIOLOGY REPORT*  Clinical Data: Status post right hip replacement for arthritis.  RIGHT HIP - COMPLETE 2+ VIEW  Comparison: None.  Findings: Two frontal C-arm views of the right hip demonstrate a total hip prosthesis in satisfactory position and alignment.  No fracture or dislocation is seen.  IMPRESSION: Satisfactory postoperative appearance of a right total hip prosthesis.  Original Report Authenticated By: Darrol Angel, M.D.   Dg Pelvis Portable  04/28/2012  *RADIOLOGY REPORT*  Clinical Data: Osteoarthritis of the right hip.  Status post right total hip replacement.  PORTABLE PELVIS  Comparison: Intraoperative images.  Findings: Acetabular and femoral components of the right total hip prosthesis appear in excellent position.  No fractures.  Severe osteoarthritis of the left hip is noted.  IMPRESSION: Satisfactory appearance of the right hip in the AP projection after total hip prosthesis insertion.  Original Report Authenticated By: Gwynn Burly, M.D.   Dg Hip Portable 1 View Right  04/28/2012  *RADIOLOGY REPORT*  Clinical Data: Osteoarthritis of the right hip.  Status post right total hip replacement.  PORTABLE RIGHT HIP - 1 VIEW  Comparison: Intraoperative AP view dated 04/28/2012  Findings: Lateral view of the hip demonstrates acetabular and femoral components to be in excellent position.  IMPRESSION: Satisfactory appearance of the right hip in the lateral projection after total hip replacement.  Original Report Authenticated By: Gwynn Burly, M.D.   Dg C-arm 61-120 Min-no Report  04/28/2012  CLINICAL DATA: Hip Film   C-ARM 61-120 MINUTES   Fluoroscopy was utilized by the requesting physician.  No radiographic  interpretation.      Disposition: 01-Home or Self Care  Discharge Orders  Future Orders Please Complete By Expires   Diet - low sodium heart healthy      Call MD / Call 911      Comments:   If you experience chest pain or shortness of breath, CALL 911 and be transported to the hospital emergency room.  If you develope a fever above 101 F, pus (white drainage) or increased drainage or redness at the wound, or calf pain, call your surgeon's office.   Constipation Prevention      Comments:   Drink plenty of fluids.  Prune juice may be helpful.  You may use a stool softener, such as Colace (over the counter) 100 mg twice a day.  Use MiraLax (over the counter) for constipation as needed.   Increase activity slowly as tolerated      Discharge instructions      Comments:   Increase your activities as comfort allows. Ice as needed.  Expect swelling. You can get your incision wet starting this coming Wed. Call [facility] [phone] for follow-up in 2 weeks.   Discharge patient            Signed: Kathryne Hitch 04/30/2012, 1:16 PM

## 2012-04-30 NOTE — Progress Notes (Signed)
Pt refused Colace as she has Hx IBS. Daily BM's

## 2012-05-01 ENCOUNTER — Encounter (HOSPITAL_COMMUNITY): Payer: Self-pay | Admitting: Orthopaedic Surgery

## 2012-05-01 MED FILL — Methotrexate Sodium Tab 2.5 MG (Base Equiv): ORAL | Qty: 4 | Status: AC

## 2012-05-01 NOTE — Anesthesia Postprocedure Evaluation (Signed)
  Anesthesia Post-op Note  Patient: [name] [name]  Procedure(s) Performed: Procedure(s) (LRB): TOTAL HIP ARTHROPLASTY ANTERIOR APPROACH (Right)  Patient Location: PACU  Anesthesia Type: General  Level of Consciousness: awake and alert   Airway and Oxygen Therapy: Patient Spontanous Breathing  Post-op Pain: mild  Post-op Assessment: Post-op Vital signs reviewed, Patient's Cardiovascular Status Stable, Respiratory Function Stable, Patent Airway and No signs of Nausea or vomiting  Post-op Vital Signs: stable  Complications: No apparent anesthesia complications

## 2012-05-06 ENCOUNTER — Emergency Department (HOSPITAL_COMMUNITY)
Admission: EM | Admit: 2012-05-06 | Discharge: 2012-05-06 | Disposition: A | Discharge status: Home / Self Care | Payer: Medicaid Other | Attending: Emergency Medicine | Admitting: Emergency Medicine

## 2012-05-06 ENCOUNTER — Encounter (HOSPITAL_COMMUNITY): Payer: Self-pay | Admitting: *Deleted

## 2012-05-06 DIAGNOSIS — M069 Rheumatoid arthritis, unspecified: Secondary | ICD-10-CM | POA: Insufficient documentation

## 2012-05-06 DIAGNOSIS — B49 Unspecified mycosis: Secondary | ICD-10-CM | POA: Insufficient documentation

## 2012-05-06 DIAGNOSIS — K219 Gastro-esophageal reflux disease without esophagitis: Secondary | ICD-10-CM | POA: Insufficient documentation

## 2012-05-06 DIAGNOSIS — Z79899 Other long term (current) drug therapy: Secondary | ICD-10-CM | POA: Insufficient documentation

## 2012-05-06 LAB — BASIC METABOLIC PANEL
BUN: 12 mg/dL (ref 6–23)
CO2: 25 mEq/L (ref 19–32)
Calcium: 8.6 mg/dL (ref 8.4–10.5)
Creatinine, Ser: 0.45 mg/dL — ABNORMAL LOW (ref 0.50–1.10)
Glucose, Bld: 89 mg/dL (ref 70–99)

## 2012-05-06 LAB — URINALYSIS, ROUTINE W REFLEX MICROSCOPIC
Bilirubin Urine: NEGATIVE
Protein, ur: NEGATIVE mg/dL
Specific Gravity, Urine: 1.021 (ref 1.005–1.030)
Urobilinogen, UA: 1 mg/dL (ref 0.0–1.0)

## 2012-05-06 LAB — WET PREP, GENITAL

## 2012-05-06 LAB — CBC WITH DIFFERENTIAL/PLATELET
Basophils Absolute: 0 10*3/uL (ref 0.0–0.1)
Eosinophils Relative: 1 % (ref 0–5)
HCT: 28.7 % — ABNORMAL LOW (ref 36.0–46.0)
Hemoglobin: 9.5 g/dL — ABNORMAL LOW (ref 12.0–15.0)
Lymphocytes Relative: 9 % — ABNORMAL LOW (ref 12–46)
MCV: 92.3 fL (ref 78.0–100.0)
Monocytes Absolute: 0.3 10*3/uL (ref 0.1–1.0)
Monocytes Relative: 5 % (ref 3–12)
RDW: 14 % (ref 11.5–15.5)
WBC: 5.8 10*3/uL (ref 4.0–10.5)

## 2012-05-06 LAB — URINE MICROSCOPIC-ADD ON

## 2012-05-06 MED ORDER — MORPHINE SULFATE 4 MG/ML IJ SOLN
4.0000 mg | Freq: Once | INTRAMUSCULAR | Status: AC
  Administered 2012-05-06: 4 mg via INTRAMUSCULAR
  Filled 2012-05-06: qty 1

## 2012-05-06 MED ORDER — ONDANSETRON 8 MG PO TBDP
8.0000 mg | ORAL_TABLET | Freq: Once | ORAL | Status: AC
  Administered 2012-05-06: 8 mg via ORAL

## 2012-05-06 MED ORDER — NYSTATIN 100000 UNIT/GM EX CREA: TOPICAL_CREAM | CUTANEOUS | Status: DC

## 2012-05-06 MED ORDER — ONDANSETRON 8 MG PO TBDP
ORAL_TABLET | ORAL | Status: AC
  Filled 2012-05-06: qty 1

## 2012-05-06 MED ORDER — DIPHENHYDRAMINE HCL 50 MG/ML IJ SOLN
50.0000 mg | Freq: Once | INTRAMUSCULAR | Status: AC
  Administered 2012-05-06: 50 mg via INTRAMUSCULAR
  Filled 2012-05-06: qty 1

## 2012-05-06 MED ORDER — DIPHENHYDRAMINE HCL 25 MG PO TABS: 50.0000 mg | ORAL_TABLET | Freq: Four times a day (QID) | ORAL | Status: DC

## 2012-05-06 MED ORDER — ONDANSETRON 8 MG PO TBDP
ORAL_TABLET | ORAL | Status: AC
  Administered 2012-05-06: 8 mg via ORAL
  Filled 2012-05-06: qty 1

## 2012-05-06 NOTE — ED Notes (Signed)
Pt reports rash on inner thighs and pelvic area. Pt had hip replacement one week and one day ago.  Pt reports symptoms started then and have become worse since. Pt denies history of the similar rash. Pt has tried baby powder and tinactin without relief.  Pt denies vaginal discharge or urinary issues. Pt has history of RA which was why she had the hip replacement.

## 2012-05-06 NOTE — ED Notes (Signed)
Pt became nauseous and began dry heaving after morphine.  Dr Hyman Hopes at bedside, orders received for Zofran.

## 2012-05-06 NOTE — ED Notes (Signed)
Pt tossing in the bed, crying.  States the itching has become unbearable.

## 2012-05-06 NOTE — ED Provider Notes (Signed)
History     CSN: [id number]  Arrival date & time 05/06/12  1312   First MD Initiated Contact with Patient 05/06/12 1503      Chief Complaint  Patient presents with  . Rash  . Post-op Problem    (Consider location/radiation/quality/duration/timing/severity/associated sxs/prior treatment) HPI  H/o RA C/o 10/10 burning pain and itching to perineum. Denies fever/chills. Denies abdominal pain, N/V. Denies hematuria/dysuria/freq/urgency.  Denies known vaginal discharge. No rectal pain/itching. Nl BM today in Emergency Department. States the rash has been present x 7 days but now worsening. Began while she was hosp for recent Rt hip arthroplasty. Has tried tinactin spray with min relief. States "it itches so bad it's going to kill me, I'm going to die". Denies hematuria/dysuria/freq/urgency   Immunosuppressive meds incl plaquenil, methotrexate, humira   ED Notes, ED Provider Notes from 05/06/12 0000 to 05/06/12 13:44:04       [facility], RN 05/06/2012 13:26      Pt reports rash on inner thighs and pelvic area. Pt had hip replacement one week and one day ago. Pt reports symptoms started then and have become worse since. Pt denies history of the similar rash. Pt has tried baby powder and tinactin without relief. Pt denies vaginal discharge or urinary issues. Pt has history of RA which was why she had the hip replacement.     Past Medical History  Diagnosis Date  . Arthritis     RA - DX'D AT AGE 89--PT HAVING SEVERE PAIN IN BOTH HIPS==PLANS RT HIP REPLACEMENT FIRST.  Marland Kitchen GERD (gastroesophageal reflux disease)     NO RECENT PROBLEMS--HAS LEARNED TO CONTROL WITH DIET  . Depression   . Headache     SHOOTING PAINS INSIDE OF HEAD--STATES SHE HAS HAD TESTING WHICH WAS NEGATIVE FOR ANY PROBLEMS.   NO RECENT HEAD PAINS  . Anemia     AND VIT B 12 DEFICIENCY--TAKES IRON PILL AS NEEDED  . IBS (irritable bowel syndrome)   . Scoliosis   . Scars     SCARS ON BODY CAUSED BY SORES FROM  METHOTREXATE--ALSO SORES ON TONGUE AT TIMES  . Chronic night sweats     Past Surgical History  Procedure Date  . Nasal septum surgery 02/28/2011  . Wisdom teeth extracted   . Left thumb surgery ate 3 after injury to the thumb   . Total hip arthroplasty 04/28/2012    Procedure: TOTAL HIP ARTHROPLASTY ANTERIOR APPROACH;  Surgeon: Kathryne Hitch, MD;  Location: [facility];  Service: Orthopedics;  Laterality: Right;  Right Total Hip Arthroplasty    No family history on file.  History  Substance Use Topics  . Smoking status: Never Smoker   . Smokeless tobacco: Never Used  . Alcohol Use: No    OB History    Grav Para Term Preterm Abortions TAB SAB Ect Mult Living                  Review of Systems  All other systems reviewed and are negative.  except as noted HPI   Allergies  Other  Home Medications   Current Outpatient Rx  Name Route Sig Dispense Refill  . ADALIMUMAB 40 MG/0.8ML [place] KIT Subcutaneous Inject 40 mg into the skin every 7 (seven) days. On Saturdays    . ASPIRIN 325 MG PO TBEC Oral Take 1 tablet (325 mg total) by mouth 2 (two) times daily. 60 tablet 0  . CALCIUM CARBONATE-VITAMIN D 600-400 MG-UNIT PO TABS Oral Take 1 tablet by  mouth 2 (two) times daily.     . DULOXETINE HCL 30 MG PO CPEP Oral Take 30 mg by mouth 2 (two) times daily.    Marland Kitchen FERROUS SULFATE 325 (65 FE) MG PO TABS Oral Take 325 mg by mouth daily as needed. As needed when patient feels she needs it.    Marland Kitchen FOLIC ACID 1 MG PO TABS Oral Take 1 mg by mouth 2 (two) times daily.    Marland Kitchen HYDROCODONE-ACETAMINOPHEN 5-500 MG PO TABS Oral Take 1 tablet by mouth every 4 (four) hours as needed. For pain 30 tablet 1  . HYDROXYCHLOROQUINE SULFATE 200 MG PO TABS Oral Take 200 mg by mouth 2 (two) times daily.    Marland Kitchen METHOCARBAMOL 500 MG PO TABS Oral Take 1 tablet (500 mg total) by mouth every 6 (six) hours as needed. 30 tablet 1  . METHOTREXATE (ANTI-RHEUMATIC) 2.5 MG PO TABS Oral Take 20 mg by mouth once a week. On  Saturdays    . TOLNAFTATE 1 % EX AERP Topical Apply 1 spray topically once.    Marland Kitchen DIPHENHYDRAMINE HCL 25 MG PO TABS Oral Take 2 tablets (50 mg total) by mouth every 6 (six) hours. 30 tablet 0  . NYSTATIN 100000 UNIT/GM EX CREA  Apply to affected area 2 times daily 15 g 0    BP 103/57  Pulse 87  Temp 98.6 F (37 C) (Oral)  Resp 18  SpO2 99%  LMP 05/01/2012  Physical Exam  Nursing note and vitals reviewed. Constitutional: She is oriented to person, place, and time. She appears well-developed.       Writhing in pain  HENT:  Head: Atraumatic.  Mouth/Throat: Oropharynx is clear and moist.  Eyes: Conjunctivae and EOM are normal. Pupils are equal, round, and reactive to light.  Neck: Normal range of motion. Neck supple.  Cardiovascular: Normal rate, regular rhythm, normal heart sounds and intact distal pulses.   Pulmonary/Chest: Effort normal and breath sounds normal. No respiratory distress. She has no wheezes. She has no rales.  Abdominal: Soft. She exhibits no distension. There is no tenderness. There is no rebound and no guarding.  Genitourinary:          Min brown blood in vault No CMT No r/l adnexal ttp Hymen intact  Musculoskeletal: Normal range of motion.  Neurological: She is alert and oriented to person, place, and time.  Skin: Skin is warm and dry. No rash noted.  Psychiatric: She has a normal mood and affect.    ED Course  Procedures (including critical care time)  Labs Reviewed  URINALYSIS, ROUTINE W REFLEX MICROSCOPIC - Abnormal; Notable for the following:    Hgb urine dipstick SMALL (*)     Leukocytes, UA MODERATE (*)     All other components within normal limits  CBC WITH DIFFERENTIAL - Abnormal; Notable for the following:    RBC 3.11 (*)     Hemoglobin 9.5 (*)     HCT 28.7 (*)     Neutrophils Relative 85 (*)     Lymphocytes Relative 9 (*)     Lymphs Abs 0.5 (*)     All other components within normal limits  BASIC METABOLIC PANEL - Abnormal; Notable for  the following:    Creatinine, Ser 0.45 (*)     All other components within normal limits  WET PREP, GENITAL - Abnormal; Notable for the following:    WBC, Wet Prep HPF POC FEW (*)     All other components within normal limits  PREGNANCY, URINE  URINE MICROSCOPIC-ADD ON  GC/CHLAMYDIA PROBE AMP, GENITAL  URINE CULTURE   No results found.   1. Fungal infection     MDM  Immunosuppressed. Severe itching/rash/chafing of perineum. No ttp. No concern for Fournier's gangrene at this time. Feeling better after morphine and benadryl. Repeat exam unchanged. Will send urine for culture. Nystatin cream. No EMC precluding discharge at this time. Given Precautions for return. PMD f/u.         Forbes Cellar, MD 05/06/12 1905

## 2012-05-08 LAB — GC/CHLAMYDIA PROBE AMP, GENITAL
Chlamydia, DNA Probe: NEGATIVE
GC Probe Amp, Genital: NEGATIVE

## 2012-05-08 LAB — URINE CULTURE

## 2012-05-15 ENCOUNTER — Other Ambulatory Visit (HOSPITAL_COMMUNITY): Payer: Self-pay | Admitting: Orthopaedic Surgery

## 2012-05-31 ENCOUNTER — Encounter (HOSPITAL_COMMUNITY): Payer: Self-pay | Admitting: Pharmacy Technician

## 2012-06-06 ENCOUNTER — Encounter (HOSPITAL_COMMUNITY): Payer: Self-pay

## 2012-06-06 ENCOUNTER — Encounter (HOSPITAL_COMMUNITY)
Admission: RE | Admit: 2012-06-06 | Discharge: 2012-06-06 | Disposition: A | Discharge status: Home / Self Care | Payer: Medicaid Other | Source: Ambulatory Visit | Attending: Orthopaedic Surgery | Admitting: Orthopaedic Surgery

## 2012-06-06 LAB — BASIC METABOLIC PANEL
Chloride: 104 mEq/L (ref 96–112)
GFR calc Af Amer: >90 mL/min (ref >90)
Potassium: 3.5 mEq/L (ref 3.5–5.1)

## 2012-06-06 LAB — URINALYSIS, ROUTINE W REFLEX MICROSCOPIC
Glucose, UA: NEGATIVE mg/dL
Hgb urine dipstick: NEGATIVE
Specific Gravity, Urine: 1.034 — ABNORMAL HIGH (ref 1.005–1.030)

## 2012-06-06 LAB — CBC
HCT: 38.7 % (ref 36.0–46.0)
Hemoglobin: 12.4 g/dL (ref 12.0–15.0)
WBC: 6.2 10*3/uL (ref 4.0–10.5)

## 2012-06-06 LAB — APTT: aPTT: 38 seconds — ABNORMAL HIGH (ref 24–37)

## 2012-06-06 LAB — SURGICAL PCR SCREEN
MRSA, PCR: NEGATIVE
Staphylococcus aureus: NEGATIVE

## 2012-06-06 LAB — PROTIME-INR: Prothrombin Time: 13.8 seconds (ref 11.6–15.2)

## 2012-06-06 NOTE — Patient Instructions (Addendum)
20 [name] [name]  06/06/2012   Your procedure is scheduled on:  06/09/12  Friday   Surgery [id number]  Report to [facility] at  0515     AM.  Call this number if you have problems the morning of surgery: [phone]     Or PST   [id number]  [facility]   Remember:   Do not eat food or drink any fluids :After Midnight. Thursday NIGHT     Take these medicines the morning of surgery with A SIP OF WATER:  CYMBALTA,   ZANTAC May take Vicodin if needed  Do not wear jewelry, make-up or nail polish.  Do not wear lotions, powders, or perfumes. You may wear deodorant.  Do not shave 48 hours prior to surgery.  Do not bring valuables to the hospital.  Contacts, dentures or bridgework may not be worn into surgery.  Leave suitcase in the car. After surgery it may be brought to your room.  For patients admitted to the hospital, checkout time is 11:00 AM the day of discharge.   Patients discharged the day of surgery will not be allowed to drive home.  Name and phone number of your drivermom:                                                                      Special Instructions: CHG Shower Use Special Wash: 1/2 bottle night before surgery and 1/2 bottle morning of surgery. REGULAR SOAP FACE AND PRIVATES              LADIES- NO SHAVING 48 HOURS BEFORE USING BETASEPT SOAP.                Please read over the following fact sheets that you were given: MRSA Information

## 2012-06-06 NOTE — Pre-Procedure Instructions (Signed)
States rheumatologist Dr Orlin Hilding- [facility] Sprang at [place],    Dr Lovell Sheehan at [facility] family medicine covers depression meds

## 2012-06-08 LAB — TYPE AND SCREEN

## 2012-06-09 ENCOUNTER — Encounter (HOSPITAL_COMMUNITY): Payer: Self-pay | Admitting: Anesthesiology

## 2012-06-09 ENCOUNTER — Encounter (HOSPITAL_COMMUNITY)
Admission: RE | Disposition: A | Discharge status: Home / Self Care | Payer: Self-pay | Source: Ambulatory Visit | Attending: Orthopaedic Surgery

## 2012-06-09 ENCOUNTER — Inpatient Hospital Stay (HOSPITAL_COMMUNITY): Payer: Medicaid Other

## 2012-06-09 ENCOUNTER — Inpatient Hospital Stay (HOSPITAL_COMMUNITY): Payer: Medicaid Other | Admitting: Anesthesiology

## 2012-06-09 ENCOUNTER — Inpatient Hospital Stay (HOSPITAL_COMMUNITY)
Admission: RE | Admit: 2012-06-09 | Discharge: 2012-06-10 | DRG: 470 | Disposition: A | Discharge status: Home / Self Care | Payer: Medicaid Other | Source: Ambulatory Visit | Attending: Orthopaedic Surgery | Admitting: Orthopaedic Surgery

## 2012-06-09 ENCOUNTER — Encounter (HOSPITAL_COMMUNITY): Payer: Self-pay | Admitting: *Deleted

## 2012-06-09 DIAGNOSIS — K589 Irritable bowel syndrome without diarrhea: Secondary | ICD-10-CM | POA: Diagnosis present

## 2012-06-09 DIAGNOSIS — M08052 Unspecified juvenile rheumatoid arthritis, left hip: Secondary | ICD-10-CM

## 2012-06-09 DIAGNOSIS — E538 Deficiency of other specified B group vitamins: Secondary | ICD-10-CM | POA: Diagnosis present

## 2012-06-09 DIAGNOSIS — M083 Juvenile rheumatoid polyarthritis (seronegative): Principal | ICD-10-CM | POA: Diagnosis present

## 2012-06-09 DIAGNOSIS — M4120 Other idiopathic scoliosis, site unspecified: Secondary | ICD-10-CM | POA: Diagnosis present

## 2012-06-09 DIAGNOSIS — D649 Anemia, unspecified: Secondary | ICD-10-CM | POA: Diagnosis present

## 2012-06-09 DIAGNOSIS — Z96649 Presence of unspecified artificial hip joint: Secondary | ICD-10-CM

## 2012-06-09 DIAGNOSIS — R61 Generalized hyperhidrosis: Secondary | ICD-10-CM | POA: Diagnosis present

## 2012-06-09 DIAGNOSIS — M247 Protrusio acetabuli: Secondary | ICD-10-CM | POA: Diagnosis present

## 2012-06-09 DIAGNOSIS — Z01812 Encounter for preprocedural laboratory examination: Secondary | ICD-10-CM

## 2012-06-09 DIAGNOSIS — Z79899 Other long term (current) drug therapy: Secondary | ICD-10-CM

## 2012-06-09 DIAGNOSIS — Z7982 Long term (current) use of aspirin: Secondary | ICD-10-CM

## 2012-06-09 HISTORY — PX: TOTAL HIP ARTHROPLASTY: SHX124

## 2012-06-09 SURGERY — ARTHROPLASTY, HIP, TOTAL, ANTERIOR APPROACH
Anesthesia: General | Site: Hip | Laterality: Left | Wound class: Clean

## 2012-06-09 MED ORDER — HYDROMORPHONE HCL PF 1 MG/ML IJ SOLN
INTRAMUSCULAR | Status: AC
  Filled 2012-06-09: qty 1

## 2012-06-09 MED ORDER — PROPOFOL 10 MG/ML IV BOLUS
INTRAVENOUS | Status: DC | PRN
  Administered 2012-06-09: 150 mg via INTRAVENOUS

## 2012-06-09 MED ORDER — METHOCARBAMOL 500 MG PO TABS
500.0000 mg | ORAL_TABLET | Freq: Four times a day (QID) | ORAL | Status: DC | PRN
  Administered 2012-06-09: 500 mg via ORAL
  Filled 2012-06-09 (×2): qty 1

## 2012-06-09 MED ORDER — ONDANSETRON HCL 4 MG/2ML IJ SOLN: 4.0000 mg | Freq: Four times a day (QID) | INTRAMUSCULAR | Status: DC | PRN

## 2012-06-09 MED ORDER — FERROUS SULFATE 325 (65 FE) MG PO TABS
325.0000 mg | ORAL_TABLET | Freq: Three times a day (TID) | ORAL | Status: DC
  Administered 2012-06-09 – 2012-06-10 (×3): 325 mg via ORAL
  Filled 2012-06-09 (×5): qty 1

## 2012-06-09 MED ORDER — METOCLOPRAMIDE HCL 5 MG/ML IJ SOLN: 5.0000 mg | Freq: Three times a day (TID) | INTRAMUSCULAR | Status: DC | PRN

## 2012-06-09 MED ORDER — NEOSTIGMINE METHYLSULFATE 1 MG/ML IJ SOLN
INTRAMUSCULAR | Status: DC | PRN
  Administered 2012-06-09: 3 mg via INTRAVENOUS

## 2012-06-09 MED ORDER — DULOXETINE HCL 30 MG PO CPEP
30.0000 mg | ORAL_CAPSULE | Freq: Two times a day (BID) | ORAL | Status: DC
  Administered 2012-06-09 – 2012-06-10 (×3): 30 mg via ORAL
  Filled 2012-06-09 (×4): qty 1

## 2012-06-09 MED ORDER — OXYCODONE HCL 5 MG PO TABS: 5.0000 mg | ORAL_TABLET | ORAL | Status: DC | PRN

## 2012-06-09 MED ORDER — LACTATED RINGERS IV SOLN
INTRAVENOUS | Status: DC | PRN
  Administered 2012-06-09 (×3): via INTRAVENOUS

## 2012-06-09 MED ORDER — ONDANSETRON HCL 4 MG PO TABS: 4.0000 mg | ORAL_TABLET | Freq: Four times a day (QID) | ORAL | Status: DC | PRN

## 2012-06-09 MED ORDER — HYDROMORPHONE HCL PF 1 MG/ML IJ SOLN
INTRAMUSCULAR | Status: DC | PRN
  Administered 2012-06-09 (×2): 1 mg via INTRAVENOUS

## 2012-06-09 MED ORDER — PHENOL 1.4 % MT LIQD: 1.0000 | OROMUCOSAL | Status: DC | PRN

## 2012-06-09 MED ORDER — ACETAMINOPHEN 10 MG/ML IV SOLN
INTRAVENOUS | Status: DC | PRN
  Administered 2012-06-09: 1000 mg via INTRAVENOUS

## 2012-06-09 MED ORDER — CALCIUM CARBONATE-VITAMIN D 600-400 MG-UNIT PO TABS
1.0000 | ORAL_TABLET | Freq: Two times a day (BID) | ORAL | Status: DC
Start: 2012-06-09 — End: 2012-06-09

## 2012-06-09 MED ORDER — CEFAZOLIN SODIUM 1-5 GM-% IV SOLN
1.0000 g | Freq: Four times a day (QID) | INTRAVENOUS | Status: AC
  Administered 2012-06-09 (×2): 1 g via INTRAVENOUS
  Filled 2012-06-09 (×3): qty 50

## 2012-06-09 MED ORDER — ROCURONIUM BROMIDE 100 MG/10ML IV SOLN
INTRAVENOUS | Status: DC | PRN
  Administered 2012-06-09: 30 mg via INTRAVENOUS
  Administered 2012-06-09: 10 mg via INTRAVENOUS

## 2012-06-09 MED ORDER — METOCLOPRAMIDE HCL 10 MG PO TABS
10.0000 mg | ORAL_TABLET | Freq: Four times a day (QID) | ORAL | Status: DC
  Administered 2012-06-09 – 2012-06-10 (×2): 10 mg via ORAL
  Filled 2012-06-09 (×6): qty 1

## 2012-06-09 MED ORDER — ZOLPIDEM TARTRATE 5 MG PO TABS: 5.0000 mg | ORAL_TABLET | Freq: Every evening | ORAL | Status: DC | PRN

## 2012-06-09 MED ORDER — ACETAMINOPHEN 325 MG PO TABS: 650.0000 mg | ORAL_TABLET | Freq: Four times a day (QID) | ORAL | Status: DC | PRN

## 2012-06-09 MED ORDER — DOCUSATE SODIUM 100 MG PO CAPS
100.0000 mg | ORAL_CAPSULE | Freq: Two times a day (BID) | ORAL | Status: DC
  Administered 2012-06-09 – 2012-06-10 (×3): 100 mg via ORAL

## 2012-06-09 MED ORDER — FAMOTIDINE 20 MG PO TABS
20.0000 mg | ORAL_TABLET | Freq: Every day | ORAL | Status: DC
  Administered 2012-06-09 – 2012-06-10 (×2): 20 mg via ORAL
  Filled 2012-06-09 (×2): qty 1

## 2012-06-09 MED ORDER — METOCLOPRAMIDE HCL 10 MG PO TABS: 5.0000 mg | ORAL_TABLET | Freq: Three times a day (TID) | ORAL | Status: DC | PRN

## 2012-06-09 MED ORDER — CEFAZOLIN SODIUM-DEXTROSE 2-3 GM-% IV SOLR
2.0000 g | INTRAVENOUS | Status: AC
  Administered 2012-06-09: 2 g via INTRAVENOUS

## 2012-06-09 MED ORDER — HYDROXYCHLOROQUINE SULFATE 200 MG PO TABS
200.0000 mg | ORAL_TABLET | Freq: Two times a day (BID) | ORAL | Status: DC
  Administered 2012-06-09 – 2012-06-10 (×3): 200 mg via ORAL
  Filled 2012-06-09 (×4): qty 1

## 2012-06-09 MED ORDER — ACETAMINOPHEN 10 MG/ML IV SOLN
INTRAVENOUS | Status: AC
  Filled 2012-06-09: qty 100

## 2012-06-09 MED ORDER — FENTANYL CITRATE 0.05 MG/ML IJ SOLN
INTRAMUSCULAR | Status: DC | PRN
  Administered 2012-06-09: 100 ug via INTRAVENOUS
  Administered 2012-06-09 (×3): 50 ug via INTRAVENOUS

## 2012-06-09 MED ORDER — ASPIRIN EC 325 MG PO TBEC
325.0000 mg | DELAYED_RELEASE_TABLET | Freq: Two times a day (BID) | ORAL | Status: DC
  Administered 2012-06-10: 325 mg via ORAL
  Filled 2012-06-09 (×3): qty 1

## 2012-06-09 MED ORDER — SODIUM CHLORIDE 0.9 % IV SOLN
INTRAVENOUS | Status: DC
  Administered 2012-06-09 – 2012-06-10 (×3): via INTRAVENOUS

## 2012-06-09 MED ORDER — MUPIROCIN CALCIUM 2 % NA OINT: 1.0000 "application " | TOPICAL_OINTMENT | Freq: Two times a day (BID) | NASAL | Status: DC

## 2012-06-09 MED ORDER — KETOROLAC TROMETHAMINE 15 MG/ML IJ SOLN
15.0000 mg | Freq: Four times a day (QID) | INTRAMUSCULAR | Status: AC
  Administered 2012-06-09 – 2012-06-10 (×3): 15 mg via INTRAVENOUS
  Filled 2012-06-09 (×2): qty 1

## 2012-06-09 MED ORDER — CHOLECALCIFEROL 10 MCG (400 UNIT) PO TABS
400.0000 [IU] | ORAL_TABLET | Freq: Two times a day (BID) | ORAL | Status: DC
  Administered 2012-06-09 – 2012-06-10 (×3): 400 [IU] via ORAL
  Filled 2012-06-09 (×4): qty 1

## 2012-06-09 MED ORDER — CALCIUM CARBONATE-VITAMIN D 500-200 MG-UNIT PO TABS
1.0000 | ORAL_TABLET | Freq: Two times a day (BID) | ORAL | Status: DC
  Administered 2012-06-09 – 2012-06-10 (×3): 1 via ORAL
  Filled 2012-06-09 (×4): qty 1

## 2012-06-09 MED ORDER — PROMETHAZINE HCL 25 MG/ML IJ SOLN
INTRAMUSCULAR | Status: AC
  Filled 2012-06-09: qty 1

## 2012-06-09 MED ORDER — DEXAMETHASONE SODIUM PHOSPHATE 10 MG/ML IJ SOLN
INTRAMUSCULAR | Status: DC | PRN
  Administered 2012-06-09: 10 mg via INTRAVENOUS

## 2012-06-09 MED ORDER — GLYCOPYRROLATE 0.2 MG/ML IJ SOLN
INTRAMUSCULAR | Status: DC | PRN
  Administered 2012-06-09: 0.4 mg via INTRAVENOUS

## 2012-06-09 MED ORDER — ONDANSETRON HCL 4 MG/2ML IJ SOLN
INTRAMUSCULAR | Status: DC | PRN
  Administered 2012-06-09: 4 mg via INTRAVENOUS

## 2012-06-09 MED ORDER — MENTHOL 3 MG MT LOZG: 1.0000 | LOZENGE | OROMUCOSAL | Status: DC | PRN

## 2012-06-09 MED ORDER — MIDAZOLAM HCL 5 MG/5ML IJ SOLN
INTRAMUSCULAR | Status: DC | PRN
  Administered 2012-06-09: 2 mg via INTRAVENOUS

## 2012-06-09 MED ORDER — DIPHENHYDRAMINE HCL 12.5 MG/5ML PO ELIX: 12.5000 mg | ORAL_SOLUTION | ORAL | Status: DC | PRN

## 2012-06-09 MED ORDER — SUCCINYLCHOLINE CHLORIDE 20 MG/ML IJ SOLN
INTRAMUSCULAR | Status: DC | PRN
  Administered 2012-06-09: 100 mg via INTRAVENOUS

## 2012-06-09 MED ORDER — TRAZODONE HCL 150 MG PO TABS
150.0000 mg | ORAL_TABLET | Freq: Every day | ORAL | Status: DC
  Filled 2012-06-09 (×2): qty 1

## 2012-06-09 MED ORDER — MORPHINE SULFATE 2 MG/ML IJ SOLN: 2.0000 mg | INTRAMUSCULAR | Status: DC | PRN

## 2012-06-09 MED ORDER — PROMETHAZINE HCL 25 MG/ML IJ SOLN
6.2500 mg | INTRAMUSCULAR | Status: DC | PRN
  Administered 2012-06-09: 6.25 mg via INTRAVENOUS

## 2012-06-09 MED ORDER — KETOROLAC TROMETHAMINE 15 MG/ML IJ SOLN
INTRAMUSCULAR | Status: AC
  Filled 2012-06-09: qty 1

## 2012-06-09 MED ORDER — FOLIC ACID 1 MG PO TABS
1.0000 mg | ORAL_TABLET | Freq: Two times a day (BID) | ORAL | Status: DC
  Administered 2012-06-09 – 2012-06-10 (×3): 1 mg via ORAL
  Filled 2012-06-09 (×4): qty 1

## 2012-06-09 MED ORDER — METHOCARBAMOL 100 MG/ML IJ SOLN
500.0000 mg | Freq: Four times a day (QID) | INTRAVENOUS | Status: DC | PRN
  Administered 2012-06-09: 500 mg via INTRAVENOUS
  Filled 2012-06-09: qty 5

## 2012-06-09 MED ORDER — HYDROMORPHONE HCL PF 1 MG/ML IJ SOLN
0.2500 mg | INTRAMUSCULAR | Status: DC | PRN
  Administered 2012-06-09: 0.25 mg via INTRAVENOUS

## 2012-06-09 MED ORDER — ACETAMINOPHEN 650 MG RE SUPP: 650.0000 mg | Freq: Four times a day (QID) | RECTAL | Status: DC | PRN

## 2012-06-09 MED ORDER — CEFAZOLIN SODIUM-DEXTROSE 2-3 GM-% IV SOLR
INTRAVENOUS | Status: AC
  Filled 2012-06-09: qty 50

## 2012-06-09 MED ORDER — LIDOCAINE HCL (CARDIAC) 20 MG/ML IV SOLN
INTRAVENOUS | Status: DC | PRN
  Administered 2012-06-09: 50 mg via INTRAVENOUS

## 2012-06-09 MED ORDER — ALUM & MAG HYDROXIDE-SIMETH 200-200-20 MG/5ML PO SUSP: 30.0000 mL | ORAL | Status: DC | PRN

## 2012-06-09 SURGICAL SUPPLY — 35 items
BAG SPEC THK2 15X12 ZIP CLS (MISCELLANEOUS) ×1
BAG ZIPLOCK 12X15 (MISCELLANEOUS) ×2 IMPLANT
BLADE SAW SGTL 18X1.27X75 (BLADE) ×2 IMPLANT
CLOTH BEACON ORANGE TIMEOUT ST (SAFETY) ×2 IMPLANT
CLSR STERI-STRIP ANTIMIC 1/2X4 (GAUZE/BANDAGES/DRESSINGS) ×2 IMPLANT
DRAPE C-ARM 42X72 X-RAY (DRAPES) ×2 IMPLANT
DRAPE STERI IOBAN 125X83 (DRAPES) ×2 IMPLANT
DRAPE U-SHAPE 47X51 STRL (DRAPES) ×6 IMPLANT
DRSG MEPILEX BORDER 4X8 (GAUZE/BANDAGES/DRESSINGS) ×2 IMPLANT
DURAPREP 26ML APPLICATOR (WOUND CARE) ×2 IMPLANT
ELECT BLADE TIP CTD 4 INCH (ELECTRODE) ×2 IMPLANT
ELECT REM PT RETURN 9FT ADLT (ELECTROSURGICAL) ×2
ELECTRODE REM PT RTRN 9FT ADLT (ELECTROSURGICAL) ×1 IMPLANT
FACESHIELD LNG OPTICON STERILE (SAFETY) ×8 IMPLANT
GAUZE XEROFORM 1X8 LF (GAUZE/BANDAGES/DRESSINGS) ×2 IMPLANT
GLOVE BIO SURGEON STRL SZ7 (GLOVE) IMPLANT
GLOVE BIO SURGEON STRL SZ7.5 (GLOVE) ×2 IMPLANT
GLOVE BIOGEL PI IND STRL 7.5 (GLOVE) IMPLANT
GLOVE BIOGEL PI IND STRL 8 (GLOVE) ×1 IMPLANT
GLOVE BIOGEL PI INDICATOR 7.5 (GLOVE)
GLOVE BIOGEL PI INDICATOR 8 (GLOVE) ×1
GLOVE ECLIPSE 7.0 STRL STRAW (GLOVE) ×2 IMPLANT
GOWN STRL REIN XL XLG (GOWN DISPOSABLE) ×4 IMPLANT
KIT BASIN OR (CUSTOM PROCEDURE TRAY) ×2 IMPLANT
PACK TOTAL JOINT (CUSTOM PROCEDURE TRAY) ×2 IMPLANT
PADDING CAST COTTON 6X4 STRL (CAST SUPPLIES) ×2 IMPLANT
STAPLER VISISTAT 35W (STAPLE) IMPLANT
SUT ETHIBOND NAB CT1 #1 30IN (SUTURE) ×2 IMPLANT
SUT VIC AB 1 CT1 36 (SUTURE) ×4 IMPLANT
SUT VIC AB 2-0 CT1 27 (SUTURE) ×2
SUT VIC AB 2-0 CT1 TAPERPNT 27 (SUTURE) ×2 IMPLANT
SUT VIC AB 4-0 PS2 18 (SUTURE) ×2 IMPLANT
TOWEL OR 17X26 10 PK STRL BLUE (TOWEL DISPOSABLE) ×4 IMPLANT
TOWEL OR NON WOVEN STRL DISP B (DISPOSABLE) ×2 IMPLANT
TRAY FOLEY CATH 14FRSI W/METER (CATHETERS) ×2 IMPLANT

## 2012-06-09 NOTE — Anesthesia Procedure Notes (Signed)
Procedure Name: Intubation Date/Time: 06/09/2012 7:19 AM Performed by: Doran Clay Pre-anesthesia Checklist: Patient identified, Timeout performed, Emergency Drugs available, Suction available and Patient being monitored Patient Re-evaluated:Patient Re-evaluated prior to inductionOxygen Delivery Method: Circle system utilized Preoxygenation: Pre-oxygenation with 100% oxygen Intubation Type: IV induction Laryngoscope Size: Mac and 3 Grade View: Grade I Tube type: Oral Tube size: 7.0 mm Number of attempts: 1 Airway Equipment and Method: Stylet Placement Confirmation: ETT inserted through vocal cords under direct vision,  breath sounds checked- equal and bilateral and positive ETCO2 Secured at: 21 cm Tube secured with: Tape Dental Injury: Teeth and Oropharynx as per pre-operative assessment

## 2012-06-09 NOTE — Brief Op Note (Signed)
06/09/2012  9:19 AM  PATIENT:  [name] [name]  26 y.o. female  PRE-OPERATIVE DIAGNOSIS:  Severe rhemotoid left hip  POST-OPERATIVE DIAGNOSIS:  Severe rhemotoid left hip  PROCEDURE:  Procedure(s) (LRB): TOTAL HIP ARTHROPLASTY ANTERIOR APPROACH (Left)  SURGEON:  Surgeon(s) and Role:    * Kathryne Hitch, MD - Primary  PHYSICIAN ASSISTANT:   ASSISTANTS: none   ANESTHESIA:   general  EBL:  Total I/O In: 2400 [I.V.:2400] Out: 575 [Urine:75; Blood:500]  BLOOD ADMINISTERED:none  DRAINS: none   LOCAL MEDICATIONS USED:  NONE  SPECIMEN:  No Specimen  DISPOSITION OF SPECIMEN:  N/A  COUNTS:  YES  TOURNIQUET:  * No tourniquets in log *  DICTATION: .Other Dictation: Dictation Number [id number]  PLAN OF CARE: Admit to inpatient   PATIENT DISPOSITION:  PACU - hemodynamically stable.   Delay start of Pharmacological VTE agent (>24hrs) due to surgical blood loss or risk of bleeding: no

## 2012-06-09 NOTE — Transfer of Care (Signed)
Immediate Anesthesia Transfer of Care Note  Patient: [name] [name]  Procedure(s) Performed: Procedure(s) (LRB): TOTAL HIP ARTHROPLASTY ANTERIOR APPROACH (Left)  Patient Location: PACU  Anesthesia Type: General  Level of Consciousness: sedated  Airway & Oxygen Therapy: Patient Spontanous Breathing and Patient connected to face mask oxygen  Post-op Assessment: Report given to PACU RN and Post -op Vital signs reviewed and stable  Post vital signs: Reviewed and stable  Complications: No apparent anesthesia complications

## 2012-06-09 NOTE — Anesthesia Postprocedure Evaluation (Signed)
  Anesthesia Post-op Note  Patient: [name] [name]  Procedure(s) Performed: Procedure(s) (LRB): TOTAL HIP ARTHROPLASTY ANTERIOR APPROACH (Left)  Patient Location: PACU  Anesthesia Type: General  Level of Consciousness: awake and alert   Airway and Oxygen Therapy: Patient Spontanous Breathing  Post-op Pain: mild  Post-op Assessment: Post-op Vital signs reviewed, Patient's Cardiovascular Status Stable, Respiratory Function Stable, Patent Airway and No signs of Nausea or vomiting  Post-op Vital Signs: stable  Complications: No apparent anesthesia complications

## 2012-06-09 NOTE — H&P (Signed)
[name] [name] is an 26 y.o. female.   Chief Complaint:   Severe left hip pain; known end-stage rheumatoid arthritis HPI:   26 yo female with severe rheumatoid disease of both of her hips.  She underwent a successful right total hip replacement in July of this year.  She now wishes to proceed with a left total hip.  She understands fully the risks and benefits involved having had this recently.  Past Medical History  Diagnosis Date  . GERD (gastroesophageal reflux disease)     NO RECENT PROBLEMS--HAS LEARNED TO CONTROL WITH DIET  . Depression   . Headache     SHOOTING PAINS INSIDE OF HEAD--STATES SHE HAS HAD TESTING WHICH WAS NEGATIVE FOR ANY PROBLEMS.   NO RECENT HEAD PAINS  . Anemia     AND VIT B 12 DEFICIENCY--TAKES IRON PILL AS NEEDED  . IBS (irritable bowel syndrome)   . Scoliosis   . 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 Chronic night sweats   . Arthritis     RA since age 58    Past Surgical History  Procedure Date  . Nasal septum surgery 02/28/2011  . Wisdom teeth extracted   . Left thumb surgery ate 3 after injury to the thumb   . Total hip arthroplasty 04/28/2012    right hip    History reviewed. No pertinent family history. Social History:  reports that she has never smoked. She has never used smokeless tobacco. She reports that she does not drink alcohol or use illicit drugs.  Allergies:  Allergies  Allergen Reactions  . Other     LACTOSE INTOLERANCE--GI UPSET    Medications Prior to Admission  Medication Sig Dispense Refill  . adalimumab (HUMIRA) 40 MG/0.8ML injection Inject 40 mg into the skin every 7 (seven) days. On Saturdays      . Calcium Carbonate-Vitamin D (CALTRATE 600+D) 600-400 MG-UNIT per tablet Take 1 tablet by mouth 2 (two) times daily.       . DULoxetine (CYMBALTA) 30 MG capsule Take 30 mg by mouth 2 (two) times daily.      . ferrous sulfate 325 (65 FE) MG tablet Take 325 mg by mouth daily as  needed. As needed when patient feels she needs it.      . folic acid (FOLVITE) 1 MG tablet Take 1 mg by mouth 2 (two) times daily.      Marland Kitchen HYDROcodone-acetaminophen (VICODIN) 5-500 MG per tablet Take 1 tablet by mouth every 4 (four) hours as needed. For pain  30 tablet  1  . hydroxychloroquine (PLAQUENIL) 200 MG tablet Take 200 mg by mouth 2 (two) times daily.      . ranitidine (ZANTAC) 150 MG tablet Take 150 mg by mouth 2 (two) times daily.      Marland Kitchen aspirin 325 MG tablet Take 325 mg by mouth once a week. 1-2 a  week      . methocarbamol (ROBAXIN) 500 MG tablet Take 500 mg by mouth 4 (four) times daily.      . methotrexate (RHEUMATREX) 2.5 MG tablet Take 20 mg by mouth once a week. On Saturdays      . metoCLOPramide (REGLAN) 10 MG tablet Take 10 mg by mouth 4 (four) times daily.      . mupirocin nasal ointment (BACTROBAN) 2 % Place 1 application into the nose 2 (two) times daily.      . traZODone (DESYREL)  150 MG tablet Take 150 mg by mouth at bedtime.        No results found for this or any previous visit (from the past 48 hour(s)). No results found.  Review of Systems  All other systems reviewed and are negative.    Blood pressure 120/84, pulse 86, temperature 97.1 F (36.2 C), temperature source Oral, resp. rate 18, SpO2 100.00%. Physical Exam  Constitutional: She is oriented to person, place, and time. She appears well-developed and well-nourished.  HENT:  Head: Normocephalic and atraumatic.  Eyes: EOM are normal. Pupils are equal, round, and reactive to light.  Neck: Normal range of motion. Neck supple.  Cardiovascular: Normal rate and regular rhythm.   Respiratory: Effort normal and breath sounds normal.  GI: Soft. Bowel sounds are normal.  Musculoskeletal:       Left hip: She exhibits decreased range of motion, decreased strength and bony tenderness.  Neurological: She is alert and oriented to person, place, and time.  Skin: Skin is warm and dry.  Psychiatric: She has a normal  mood and affect.     Assessment/Plan Severe left hip pain from rheumatoid arthritis. 1) to the OR today for a left total hip replacement.  [name] [name] Y 06/09/2012, 7:20 AM

## 2012-06-09 NOTE — Progress Notes (Signed)
Report given to lorraine, rn  C/o mild pain Dressing cdi  sbar reviewed and all questions answered

## 2012-06-09 NOTE — Anesthesia Preprocedure Evaluation (Signed)
Anesthesia Evaluation  Patient identified by MRN, date of birth, ID band Patient awake    Reviewed: Allergy & Precautions, H&P , NPO status , Patient's Chart, lab work & pertinent test results  Airway Mallampati: II TM Distance: >3 FB Neck ROM: Full    Dental No notable dental hx. (+) Dental Advisory Given   Pulmonary neg pulmonary ROS,  breath sounds clear to auscultation  Pulmonary exam normal       Cardiovascular negative cardio ROS  Rhythm:Regular Rate:Normal     Neuro/Psych negative neurological ROS  negative psych ROS   GI/Hepatic negative GI ROS, Neg liver ROS,   Endo/Other  negative endocrine ROS  Renal/GU negative Renal ROS  negative genitourinary   Musculoskeletal  (+) Arthritis -, Rheumatoid disorders,    Abdominal   Peds negative pediatric ROS (+)  Hematology negative hematology ROS (+)   Anesthesia Other Findings   Reproductive/Obstetrics negative OB ROS                           Anesthesia Physical Anesthesia Plan  ASA: II  Anesthesia Plan: General   Post-op Pain Management:    Induction: Intravenous  Airway Management Planned: Oral ETT  Additional Equipment:   Intra-op Plan:   Post-operative Plan: Extubation in OR  Informed Consent: I have reviewed the patients History and Physical, chart, labs and discussed the procedure including the risks, benefits and alternatives for the proposed anesthesia with the patient or authorized representative who has indicated his/her understanding and acceptance.   Dental advisory given  Plan Discussed with: CRNA and Surgeon  Anesthesia Plan Comments:         Anesthesia Quick Evaluation

## 2012-06-10 LAB — BASIC METABOLIC PANEL
CO2: 24 mEq/L (ref 19–32)
Chloride: 103 mEq/L (ref 96–112)
Creatinine, Ser: 0.49 mg/dL — ABNORMAL LOW (ref 0.50–1.10)
GFR calc Af Amer: >90 mL/min (ref >90)
Potassium: 3.3 mEq/L — ABNORMAL LOW (ref 3.5–5.1)
Sodium: 137 mEq/L (ref 135–145)

## 2012-06-10 LAB — CBC
MCV: 91.7 fL (ref 78.0–100.0)
Platelets: 219 10*3/uL (ref 150–400)
RBC: 3.02 MIL/uL — ABNORMAL LOW (ref 3.87–5.11)
RDW: 14.2 % (ref 11.5–15.5)
WBC: 5.4 10*3/uL (ref 4.0–10.5)

## 2012-06-10 NOTE — Progress Notes (Signed)
OT Screen Order received, chart reviewed. Spoke briefly with pt who states she has all necessary DME and AE from previous surgery and will have prn A from family. Pt presents with no OT needs at this time. Will sign off.  Garrel Ridgel, OTR/L  Pager [phone] 06/10/2012

## 2012-06-10 NOTE — Evaluation (Signed)
Physical Therapy Evaluation Patient Details Name: [name] [name] MRN: [id number] DOB: [birth year] Today's Date: 06/10/2012 Time: 4098-1191 PT Time Calculation (min): 26 min  PT Assessment / Plan / Recommendation Clinical Impression  26 y.o. female with h/o RA and R THA one month ago is now POD #1 with L direct anterior THA. Pt doing well with mobility. She independently performed supine to sit to stand, and ambulated 300' with RW. Pt notes leg length discrepancy, with LLE approx. 2" longer than RLE. Pt compensates for this with L knee flexion and R ankle plantar flexion during ambulation. HHPT recommended, no DME needed.     PT Assessment  Patient needs continued PT services    Follow Up Recommendations  Home health PT    Barriers to Discharge None      Equipment Recommendations  None recommended by PT    Recommendations for Other Services     Frequency 7X/week    Precautions / Restrictions Precautions Precautions: None Precaution Comments: direct anterior hip Restrictions Weight Bearing Restrictions: Yes LLE Weight Bearing: Weight bearing as tolerated   Pertinent Vitals/Pain *4/10 L hip with activity and at rest Ice applied, LLE elevated**      Mobility  Bed Mobility Bed Mobility: Supine to Sit Supine to Sit: 6: Modified independent (Device/Increase time);HOB flat;With rails Transfers Transfers: Sit to Stand;Stand to Sit Sit to Stand: 6: Modified independent (Device/Increase time);With upper extremity assist;From bed Stand to Sit: 6: Modified independent (Device/Increase time);To chair/3-in-1;With upper extremity assist Ambulation/Gait Ambulation/Gait Assistance: 5: Supervision Ambulation Distance (Feet): 300 Feet Assistive device: Rolling walker Ambulation/Gait Assistance Details: pt reports LLE is longer than RLE, noted pt keeps L knee slightly flexed and R ankle is plantar flexed to correct for leg length discrepancy Gait Pattern: Step-through pattern    Exercises  Total Joint Exercises Ankle Circles/Pumps: AROM;Both;10 reps;Supine Heel Slides: AAROM;Left;10 reps;Supine Hip ABduction/ADduction: AAROM;Left;10 reps;Supine   PT Diagnosis: Abnormality of gait;Acute pain  PT Problem List: Pain;Decreased activity tolerance PT Treatment Interventions: Functional mobility training;Gait training;Therapeutic exercise   PT Goals Acute Rehab PT Goals PT Goal Formulation: With patient Time For Goal Achievement: 06/14/12 Potential to Achieve Goals: Good Pt will Ambulate: >150 feet;with modified independence;with rolling walker PT Goal: Ambulate - Progress: Goal set today Pt will Go Up / Down Stairs: 1-2 stairs;with supervision;with rolling walker PT Goal: Up/Down Stairs - Progress: Goal set today Pt will Perform Home Exercise Program: Independently PT Goal: Perform Home Exercise Program - Progress: Goal set today  Visit Information  Last PT Received On: 06/10/12 Assistance Needed: +1    Subjective Data  Subjective: My legs are not the same length. My left leg is 2 inches longer.  Patient Stated Goal: to get drivers license, go to college, get health back   Prior Functioning  Home Living Lives With: Other (Comment) (mother) Available Help at Discharge: Family;Friend(s) Type of Home: Apartment Home Access: Stairs to enter Entrance Stairs-Number of Steps: 1 Home Layout: One level Home Adaptive Equipment: Walker - rolling;Quad cane;Straight cane;Reacher;Long-handled shoehorn;Shower chair without back;Bedside commode/3-in-1 Prior Function Level of Independence: Independent Able to Take Stairs?: Yes Driving: No Vocation: On disability Communication Communication: No difficulties    Cognition  Overall Cognitive Status: Appears within functional limits for tasks assessed/performed Arousal/Alertness: Awake/alert Orientation Level: Appears intact for tasks assessed Behavior During Session: [facility] for tasks performed    Extremity/Trunk Assessment Right  Upper Extremity Assessment RUE ROM/Strength/Tone: [facility] for tasks assessed Left Upper Extremity Assessment LUE ROM/Strength/Tone: [facility] for tasks assessed Right Lower Extremity  Assessment RLE ROM/Strength/Tone: Within functional levels RLE Sensation: WFL - Light Touch RLE Coordination: WFL - gross/fine motor Left Lower Extremity Assessment LLE ROM/Strength/Tone: WFL for tasks assessed (Leg length discrepancy noted, LLE approx 2" longer) LLE Sensation: WFL - Light Touch LLE Coordination: WFL - gross/fine motor Trunk Assessment Trunk Assessment: Other exceptions (scoliosis)   Balance Balance Balance Assessed: Yes Static Sitting Balance Static Sitting - Balance Support: No upper extremity supported;Feet supported Static Sitting - Level of Assistance: 7: Independent Static Sitting - Comment/# of Minutes: 2  End of Session PT - End of Session Equipment Utilized During Treatment: Gait belt Activity Tolerance: Patient tolerated treatment well Patient left: in chair;with call bell/phone within reach;with family/visitor present Nurse Communication: Mobility status  GP     Ralene Bathe Kistler 06/10/2012, 9:41 AM [phone]

## 2012-06-10 NOTE — Op Note (Signed)
NAMEABREANNA, DRAWDY                 ACCOUNT NO.:  [id number]  MEDICAL RECORD NO.:  [id number]  LOCATION:  1607                         FACILITY:  [facility]  PHYSICIAN:  Vanita Panda. 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  PROCEDURE:  Left total hip arthroplasty with direct anterior approach.  IMPLANTS:  DePuy sector Gription acetabular component size 48, size 32+1 neutral polyethylene liner, Tri-Lock--Tri-Lock femoral component, size 0, size 32+5, ceramic femoral head.  SURGEON:  Vanita Panda. Magnus Ivan, M.D.  ANESTHESIA:  General.  BLOOD LOSS:  200-300 mL.  COMPLICATIONS:  None.  INDICATIONS:  Ms. Lansdale is only a 26 year old with severe rheumatoid disease of both her hips.  She underwent a successful right total hip arthroplasty last month and now wished to proceed with her left hip. She understands in detail the risks and benefits of surgery and wishes to have this done due to her severe pain and her decreased quality of life.  The risks and benefits of surgery were explained in detail and she does wish to proceed.  PROCEDURE DESCRIPTION:  After informed consent was obtained, appropriate left hip was marked.  She was brought to the operative room.  General anesthesia was obtained while she was on the stretcher.  Foley catheter was placed.  She was then placed supine on the Hana fracture table with both feet in inline and skeletal traction using traction boot, but no traction applied and a perineal post in place.  Under direct fluoroscopy we were able to obtain the hip center in the center of the pelvis for measuring her leg lengths.  We then prepped the left hip with DuraPrep and sterile drapes.   Time-out was called to identify correct patient, correct left hip.  I then made an incision just distal and posterior to the anterior-superior iliac spine and carried this obliquely down the leg.  I dissected down to the tensor fascia lata and the tensor fascia was divided longitudinally.  I then proceeded with a direct anterior approach.  We placed a Cobra retractor on the lateral neck and around the medial neck.  I cauterized the lateral femoral circumflex vessels and I then opened the hip capsule.  I placed the Cobra retractors within the hip capsule.  Using oscillating saw, I made my femoral neck cut just proximal to the lesser trochanter.  I finished the cut with an osteotome and then I placed a corkscrew guide in the femoral head and removed the femoral head in its entirety.  I then placed a bent Hohmann medially and a Cobra retractor laterally.  I cleaned the acetabular debris and then began reaming from size 42 reamer and 2 mm increments up to a 48.  We did not medialize because of her protrusio and were able to clean the acetabulum debris.  All reamers were placed under direct visualization with last reamers also placed under direct fluoroscopy to obtain my inclination and anteversion.  Once,  I was pleased with this; I placed the real size 48 acetabular component.  We used [employer] component without difficulty.  I placed a real 32+4 neutral polyethylene liner.  Attention was then turned to the femur with the leg externally rotated 90 degrees, extended, and adducted.  I gained access to the femoral canal.  Retractors were placed medially and behind the greater trochanter.  I released the piriformis.  I then began broaching with only 2 broaches for the Tri-Lock system and the zero broach had good fit and fill.  I trialed a 32+1 head followed by a 32+5, reduced this in acetabulum and her leg lengths were measured equal.  She was stable to internal and external rotation  with minimal Shuck.  I then redislocated the hip and removed all trial components.  I placed the real size 0 Tri- Lock, followed by the real 32+5 ceramic femoral head.  We reduced this back in the acetabulum and it was stable with measured leg lengths equal under direct fluoroscopy.  I then copiously irrigated the tissues with normal saline solution and closed the joint capsule with interrupted #1 Ethibond suture followed by running 0 V-Loc suture in the tensor fascia lata.  A 2-0 Vicryl subcutaneous tissue and 4-0 Monocryl subcuticular stitch.  Steri-Strips were then applied as well as well-padded sterile dressing.  Next, she was taken off the [facility] table.  Her leg lengths were also measured equal.  She was awake, extubated, and taken recovery room in stable condition.  All final counts were correct.  There were no complications noted.     Vanita Panda. Magnus Ivan, M.D.     CYB/MEDQ  D:  06/09/2012  T:  06/10/2012  Job:  [id number]

## 2012-06-10 NOTE — Progress Notes (Signed)
Discharge instructions given to pt and explained; pt vocalized understanding of instructions.

## 2012-06-10 NOTE — Discharge Summary (Signed)
Patient ID: [name] [name] MRN: [id number] DOB/AGE: 02-21-1986 26 y.o.  Admit date: 06/09/2012 Discharge date: 06/10/2012  Admission Diagnoses:  Principal Problem:  *Juvenile rheumatoid arthritis of left hip   Discharge Diagnoses:  Same  Past Medical History  Diagnosis Date  . GERD (gastroesophageal reflux disease)     NO RECENT PROBLEMS--HAS LEARNED TO CONTROL WITH DIET  . Depression   . Headache     SHOOTING PAINS INSIDE OF HEAD--STATES SHE HAS HAD TESTING WHICH WAS NEGATIVE FOR ANY PROBLEMS.   NO RECENT HEAD PAINS  . Anemia     AND VIT B 12 DEFICIENCY--TAKES IRON PILL AS NEEDED  . IBS (irritable bowel syndrome)   . Scoliosis   . 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 Chronic night sweats   . Arthritis     RA since age 9    Surgeries: Procedure(s): TOTAL HIP ARTHROPLASTY ANTERIOR APPROACH on 06/09/2012   Consultants:    Discharged Condition: Improved  Hospital Course: ADEENA BERNABE is an 26 y.o. female who was admitted 06/09/2012 for operative treatment ofJuvenile rheumatoid arthritis of left hip. Patient has severe unremitting pain that affects sleep, daily activities, and work/hobbies. After pre-op clearance the patient was taken to the operating room on 06/09/2012 and underwent  Procedure(s): TOTAL HIP ARTHROPLASTY ANTERIOR APPROACH.    Patient was given perioperative antibiotics: Anti-infectives     Start     Dose/Rate Route Frequency Ordered Stop   06/09/12 1400   hydroxychloroquine (PLAQUENIL) tablet 200 mg        200 mg Oral 2 times daily 06/09/12 1104     06/09/12 1330   ceFAZolin (ANCEF) IVPB 1 g/50 mL premix        1 g 100 mL/hr over 30 Minutes Intravenous Every 6 hours 06/09/12 1104 06/09/12 2033   06/09/12 0530   ceFAZolin (ANCEF) IVPB 2 g/50 mL premix        2 g 100 mL/hr over 30 Minutes Intravenous 60 min pre-op 06/09/12 0522 06/09/12 0725           Patient was given sequential compression  devices, early ambulation, and chemoprophylaxis to prevent DVT.  Patient benefited maximally from hospital stay and there were no complications.    Recent vital signs: Patient Vitals for the past 24 hrs:  BP Temp Temp src Pulse Resp SpO2  06/10/12 0540 101/63 mmHg 98.8 F (37.1 C) Oral 111  16  100 %  06/10/12 0158 103/62 mmHg 98.4 F (36.9 C) Oral 92  16  100 %  06/10/12 0000 - - - - 16  100 %  06/09/12 2153 107/66 mmHg 99 F (37.2 C) Oral 95  16  100 %  06/09/12 2000 - - - - 16  98 %  06/09/12 1800 103/64 mmHg 98.6 F (37 C) Oral 93  16  100 %  06/09/12 1600 - - - - 16  100 %  06/09/12 1344 99/62 mmHg 98.3 F (36.8 C) Oral 97  15  100 %  06/09/12 1244 101/67 mmHg 98.8 F (37.1 C) Oral 96  15  99 %  06/09/12 1200 - - - - 15  100 %     Recent laboratory studies:  [facility] 06/10/12 0606  WBC 5.4  HGB 8.9*  HCT 27.7*  PLT 219  NA 137  K 3.3*  CL 103  CO2 24  BUN 7  CREATININE 0.49*  GLUCOSE 93  INR --  CALCIUM 8.5     Discharge Medications:   Medication List  As of 06/10/2012 11:59 AM   TAKE these medications         aspirin 325 MG tablet   Take 325 mg by mouth once a week. 1-2 a  week      CALTRATE 600+D 600-400 MG-UNIT per tablet   Generic drug: Calcium Carbonate-Vitamin D   Take 1 tablet by mouth 2 (two) times daily.      DULoxetine 30 MG capsule   Commonly known as: CYMBALTA   Take 30 mg by mouth 2 (two) times daily.      ferrous sulfate 325 (65 FE) MG tablet   Take 325 mg by mouth daily as needed. As needed when patient feels she needs it.      folic acid 1 MG tablet   Commonly known as: FOLVITE   Take 1 mg by mouth 2 (two) times daily.      HUMIRA 40 MG/0.8ML injection   Generic drug: adalimumab   Inject 40 mg into the skin every 7 (seven) days. On Saturdays      HYDROcodone-acetaminophen 5-500 MG per tablet   Commonly known as: VICODIN   Take 1 tablet by mouth every 4 (four) hours as needed. For pain      hydroxychloroquine 200 MG tablet    Commonly known as: PLAQUENIL   Take 200 mg by mouth 2 (two) times daily.      methocarbamol 500 MG tablet   Commonly known as: ROBAXIN   Take 500 mg by mouth 4 (four) times daily.      methotrexate 2.5 MG tablet   Commonly known as: RHEUMATREX   Take 20 mg by mouth once a week. On Saturdays      metoCLOPramide 10 MG tablet   Commonly known as: REGLAN   Take 10 mg by mouth 4 (four) times daily.      mupirocin nasal ointment 2 %   Commonly known as: BACTROBAN   Place 1 application into the nose 2 (two) times daily.      ranitidine 150 MG tablet   Commonly known as: ZANTAC   Take 150 mg by mouth 2 (two) times daily.      traZODone 150 MG tablet   Commonly known as: DESYREL   Take 150 mg by mouth at bedtime.            Diagnostic Studies: Dg Hip Complete Left  06/09/2012  *RADIOLOGY REPORT*  Clinical Data: Left total hip replacement  LEFT HIP - COMPLETE 2+ VIEW  Comparison: Pelvis and left hip films of 02/25/2010  Findings:  C-arm spot films show placement of a left total hip replacement.  The femoral and acetabular components appear to be in good position.  IMPRESSION: Left total hip replacement in good position on C-arm spot films.  Original Report Authenticated By: Juline Patch, M.D.   Dg Pelvis Portable  06/09/2012  *RADIOLOGY REPORT*  Clinical Data: Left total hip replacement.  PORTABLE PELVIS  Comparison: 04/28/2012  Findings: Satisfactory appearance status post left T H R.  No adverse features. Air is present in the soft tissues.  Previous right T H R satisfactory position as well.  IMPRESSION: Satisfactory postoperative appearance.  Original Report Authenticated By: Elsie Stain, M.D.   Dg Hip Portable 1 View Left  06/09/2012  *RADIOLOGY REPORT*  Clinical Data: Postop left total hip replacement.  PORTABLE LEFT HIP - 1 VIEW  Comparison: 04/28/2012.  Findings: Portable lateral view demonstrates satisfactory position alignment left T H R.  IMPRESSION: As above.  Original  Report Authenticated By: Elsie Stain, M.D.   Dg C-arm 61-120 Min-no Report  06/09/2012  CLINICAL DATA: hip film   C-ARM 61-120 MINUTES  Fluoroscopy was utilized by the requesting physician.  No radiographic  interpretation.      Disposition: 01-Home or Self Care  Discharge Orders    Future Orders Please Complete By Expires   Diet - low sodium heart healthy      Call MD / Call 911      Comments:   If you experience chest pain or shortness of breath, CALL 911 and be transported to the hospital emergency room.  If you develope a fever above 101 F, pus (white drainage) or increased drainage or redness at the wound, or calf pain, call your surgeon's office.   Constipation Prevention      Comments:   Drink plenty of fluids.  Prune juice may be helpful.  You may use a stool softener, such as Colace (over the counter) 100 mg twice a day.  Use MiraLax (over the counter) for constipation as needed.   Increase activity slowly as tolerated      Discharge instructions      Comments:   Wait 6 days before getting your incision wet in the shower. Leave the steristrips in place. Ice as needed for swelling. Increase your activity as comfort allows Follow-up in 2 weeks   Discharge patient            Signed: Kathryne Hitch 06/10/2012, 11:59 AM

## 2012-06-10 NOTE — Progress Notes (Signed)
Subjective: 1 Day Post-Op Procedure(s) (LRB): TOTAL HIP ARTHROPLASTY ANTERIOR APPROACH (Left) Patient reports pain as mild.    Objective: Vital signs in last 24 hours: Temp:  [98.3 F (36.8 C)-99 F (37.2 C)] 98.8 F (37.1 C) (08/17 0540) Pulse Rate:  [92-111] 111  (08/17 0540) Resp:  [15-16] 16  (08/17 0540) BP: (99-107)/(62-67) 101/63 mmHg (08/17 0540) SpO2:  [98 %-100 %] 100 % (08/17 0540)  Intake/Output from previous day: 08/16 0701 - 08/17 0700 In: 3967.6 [I.V.:3867.6; IV Piggyback:100] Out: 3675 [Urine:3175; Blood:500] Intake/Output this shift: Total I/O In: -  Out: 225 [Urine:225]   Basename 06/10/12 0606  HGB 8.9*    Basename 06/10/12 0606  WBC 5.4  RBC 3.02*  HCT 27.7*  PLT 219    Basename 06/10/12 0606  NA 137  K 3.3*  CL 103  CO2 24  BUN 7  CREATININE 0.49*  GLUCOSE 93  CALCIUM 8.5   No results found for this basename: LABPT:2,INR:2 in the last 72 hours  Sensation intact distally Intact pulses distally Dorsiflexion/Plantar flexion intact Incision: dressing C/D/I  Assessment/Plan: 1 Day Post-Op Procedure(s) (LRB): TOTAL HIP ARTHROPLASTY ANTERIOR APPROACH (Left) Up with therapy Discharge to home today  [name] [name] 06/10/2012, 11:57 AM

## 2012-06-10 NOTE — Progress Notes (Deleted)
[employer] has been notified for the need of a Customer service manager.

## 2012-06-14 ENCOUNTER — Encounter (HOSPITAL_COMMUNITY): Payer: Self-pay | Admitting: Orthopaedic Surgery

## 2012-06-23 ENCOUNTER — Emergency Department (HOSPITAL_COMMUNITY)
Admission: EM | Admit: 2012-06-23 | Discharge: 2012-06-23 | Disposition: A | Discharge status: Home / Self Care | Payer: Medicaid Other | Attending: Emergency Medicine | Admitting: Emergency Medicine

## 2012-06-23 DIAGNOSIS — F329 Major depressive disorder, single episode, unspecified: Secondary | ICD-10-CM | POA: Insufficient documentation

## 2012-06-23 DIAGNOSIS — R109 Unspecified abdominal pain: Secondary | ICD-10-CM

## 2012-06-23 DIAGNOSIS — M069 Rheumatoid arthritis, unspecified: Secondary | ICD-10-CM | POA: Insufficient documentation

## 2012-06-23 DIAGNOSIS — M4120 Other idiopathic scoliosis, site unspecified: Secondary | ICD-10-CM | POA: Insufficient documentation

## 2012-06-23 DIAGNOSIS — K589 Irritable bowel syndrome without diarrhea: Secondary | ICD-10-CM | POA: Insufficient documentation

## 2012-06-23 DIAGNOSIS — K219 Gastro-esophageal reflux disease without esophagitis: Secondary | ICD-10-CM | POA: Insufficient documentation

## 2012-06-23 DIAGNOSIS — F3289 Other specified depressive episodes: Secondary | ICD-10-CM | POA: Insufficient documentation

## 2012-06-23 DIAGNOSIS — Z79899 Other long term (current) drug therapy: Secondary | ICD-10-CM | POA: Insufficient documentation

## 2012-06-23 LAB — CBC WITH DIFFERENTIAL/PLATELET
Basophils Absolute: 0 10*3/uL (ref 0.0–0.1)
Eosinophils Relative: 0 % (ref 0–5)
Lymphocytes Relative: 5 % — ABNORMAL LOW (ref 12–46)
Lymphs Abs: 0.4 10*3/uL — ABNORMAL LOW (ref 0.7–4.0)
Neutro Abs: 8.2 10*3/uL — ABNORMAL HIGH (ref 1.7–7.7)
Neutrophils Relative %: 93 % — ABNORMAL HIGH (ref 43–77)
Platelets: 356 10*3/uL (ref 150–400)
RBC: 3.86 MIL/uL — ABNORMAL LOW (ref 3.87–5.11)
RDW: 14.3 % (ref 11.5–15.5)
WBC: 8.8 10*3/uL (ref 4.0–10.5)

## 2012-06-23 LAB — COMPREHENSIVE METABOLIC PANEL
ALT: 9 U/L (ref 0–35)
AST: 14 U/L (ref 0–37)
Alkaline Phosphatase: 93 U/L (ref 39–117)
CO2: 27 mEq/L (ref 19–32)
Calcium: 9.2 mg/dL (ref 8.4–10.5)
Chloride: 101 mEq/L (ref 96–112)
GFR calc non Af Amer: >90 mL/min (ref >90)
Potassium: 3.4 mEq/L — ABNORMAL LOW (ref 3.5–5.1)
Sodium: 137 mEq/L (ref 135–145)

## 2012-06-23 LAB — URINALYSIS, ROUTINE W REFLEX MICROSCOPIC
Bilirubin Urine: NEGATIVE
Glucose, UA: NEGATIVE mg/dL
Hgb urine dipstick: NEGATIVE
Protein, ur: NEGATIVE mg/dL

## 2012-06-23 LAB — POCT PREGNANCY, URINE: Preg Test, Ur: NEGATIVE

## 2012-06-23 MED ORDER — LORAZEPAM 2 MG/ML IJ SOLN
1.0000 mg | Freq: Once | INTRAMUSCULAR | Status: AC
  Administered 2012-06-23: 1 mg via INTRAVENOUS
  Filled 2012-06-23: qty 1

## 2012-06-23 MED ORDER — SODIUM CHLORIDE 0.9 % IV BOLUS (SEPSIS): 1000.0000 mL | Freq: Once | INTRAVENOUS | Status: DC

## 2012-06-23 MED ORDER — ONDANSETRON 8 MG PO TBDP
8.0000 mg | ORAL_TABLET | Freq: Once | ORAL | Status: AC
  Administered 2012-06-23: 8 mg via ORAL
  Filled 2012-06-23: qty 1

## 2012-06-23 MED ORDER — ONDANSETRON HCL 4 MG/2ML IJ SOLN
4.0000 mg | Freq: Once | INTRAMUSCULAR | Status: AC
  Administered 2012-06-23: 4 mg via INTRAVENOUS
  Filled 2012-06-23: qty 2

## 2012-06-23 MED ORDER — ONDANSETRON HCL 4 MG/2ML IJ SOLN
4.0000 mg | Freq: Once | INTRAMUSCULAR | Status: DC
  Filled 2012-06-23: qty 2

## 2012-06-23 MED ORDER — SODIUM CHLORIDE 0.9 % IV BOLUS (SEPSIS)
1000.0000 mL | Freq: Once | INTRAVENOUS | Status: AC
  Administered 2012-06-23: 1000 mL via INTRAVENOUS

## 2012-06-23 NOTE — ED Notes (Signed)
Patient requesting to go home.  She states she can't sit still and has numbness and tingling in her lower extremities

## 2012-06-23 NOTE — ED Notes (Signed)
Pt c/o nausea, vomiting, diarrhea, increased urination for the past 4 days. Pt reports drinking a lot of water and Gatorade.

## 2012-06-23 NOTE — ED Provider Notes (Signed)
History     CSN: [id number]  Arrival date & time 06/23/12  1447   First MD Initiated Contact with Patient 06/23/12 1843      Chief Complaint  Patient presents with  . Nausea  . Emesis  . Diarrhea  . Weakness    (Consider location/radiation/quality/duration/timing/severity/associated sxs/prior treatment) Patient is a 26 y.o. female presenting with vomiting, diarrhea, and weakness. The history is provided by the patient and a parent.  Emesis  Associated symptoms include diarrhea.  Diarrhea The primary symptoms include vomiting and diarrhea.  Weakness The primary symptoms include vomiting.  Additional symptoms include weakness.   patient here with diarrhea and vomiting x3 days. Also notes increased urination without fever. History of IBS and rheumatoid arthritis and takes multiple medications for this. Recently was discharged from the hospital after hip replacement. Diarrhea has been loose and green. Some associated abdominal cramps without vaginal bleeding or discharge. History of IBS as well and those symptoms are similar. Patient feels like she is dehydrated  Past Medical History  Diagnosis Date  . GERD (gastroesophageal reflux disease)     NO RECENT PROBLEMS--HAS LEARNED TO CONTROL WITH DIET  . Depression   . Headache     SHOOTING PAINS INSIDE OF HEAD--STATES SHE HAS HAD TESTING WHICH WAS NEGATIVE FOR ANY PROBLEMS.   NO RECENT HEAD PAINS  . Anemia     AND VIT B 12 DEFICIENCY--TAKES IRON PILL AS NEEDED  . IBS (irritable bowel syndrome)   . Scoliosis   . 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 Chronic night sweats   . Arthritis     RA since age 23    Past Surgical History  Procedure Date  . Nasal septum surgery 02/28/2011  . Wisdom teeth extracted   . Left thumb surgery ate 3 after injury to the thumb   . Total hip arthroplasty 04/28/2012    right hip  . Total hip arthroplasty 06/09/2012    Procedure: TOTAL HIP  ARTHROPLASTY ANTERIOR APPROACH;  Surgeon: Kathryne Hitch, MD;  Location: [facility];  Service: Orthopedics;  Laterality: Left;  Left Total Hip Arthroplasty    No family history on file.  History  Substance Use Topics  . Smoking status: Never Smoker   . Smokeless tobacco: Never Used  . Alcohol Use: No    OB History    Grav Para Term Preterm Abortions TAB SAB Ect Mult Living                  Review of Systems  Gastrointestinal: Positive for vomiting and diarrhea.  Neurological: Positive for weakness.  All other systems reviewed and are negative.    Allergies  Other  Home Medications   Current Outpatient Rx  Name Route Sig Dispense Refill  . ADALIMUMAB 40 MG/0.8ML [place] KIT Subcutaneous Inject 40 mg into the skin every 7 (seven) days. On Saturdays    . CALCIUM CARBONATE-VITAMIN D 600-400 MG-UNIT PO TABS Oral Take 1 tablet by mouth 2 (two) times daily.     . DULOXETINE HCL 30 MG PO CPEP Oral Take 30 mg by mouth 2 (two) times daily.    Marland Kitchen FERROUS SULFATE 325 (65 FE) MG PO TABS Oral Take 325 mg by mouth daily as needed. As needed when patient feels she needs it.    Marland Kitchen FOLIC ACID 1 MG PO TABS Oral Take 1 mg by mouth 2 (two) times daily.    Marland Kitchen  HYDROCODONE-ACETAMINOPHEN 5-500 MG PO TABS Oral Take 1 tablet by mouth every 4 (four) hours as needed. For pain 30 tablet 1  . HYDROXYCHLOROQUINE SULFATE 200 MG PO TABS Oral Take 200 mg by mouth 2 (two) times daily.    Marland Kitchen METHOCARBAMOL 500 MG PO TABS Oral Take 500 mg by mouth 4 (four) times daily as needed. Muscle spasms    . METHOTREXATE (ANTI-RHEUMATIC) 2.5 MG PO TABS Oral Take 20 mg by mouth once a week. On Saturdays      BP 113/71  Pulse 100  Temp 98.5 F (36.9 C) (Oral)  Resp 20  SpO2 100%  LMP 05/25/2012  Physical Exam  Nursing note and vitals reviewed. Constitutional: She is oriented to person, place, and time. She appears well-developed and well-nourished.  Non-toxic appearance. No distress.  HENT:  Head: Normocephalic and  atraumatic.  Eyes: Conjunctivae, EOM and lids are normal. Pupils are equal, round, and reactive to light.  Neck: Normal range of motion. Neck supple. No tracheal deviation present. No mass present.  Cardiovascular: Normal rate, regular rhythm and normal heart sounds.  Exam reveals no gallop.   No murmur heard. Pulmonary/Chest: Effort normal and breath sounds normal. No stridor. No respiratory distress. She has no decreased breath sounds. She has no wheezes. She has no rhonchi. She has no rales.  Abdominal: Soft. Normal appearance and bowel sounds are normal. She exhibits no distension. There is no tenderness. There is no rebound and no CVA tenderness.  Musculoskeletal: Normal range of motion. She exhibits no edema and no tenderness.  Neurological: She is alert and oriented to person, place, and time. She has normal strength. No cranial nerve deficit or sensory deficit. GCS eye subscore is 4. GCS verbal subscore is 5. GCS motor subscore is 6.  Skin: Skin is warm and dry. No abrasion and no rash noted.  Psychiatric: She has a normal mood and affect. Her speech is normal and behavior is normal.    ED Course  Procedures (including critical care time)  Labs Reviewed  CBC WITH DIFFERENTIAL - Abnormal; Notable for the following:    RBC 3.86 (*)     Hemoglobin 11.1 (*)     HCT 34.7 (*)     Neutrophils Relative 93 (*)     Neutro Abs 8.2 (*)     Lymphocytes Relative 5 (*)     Lymphs Abs 0.4 (*)     Monocytes Relative 2 (*)     All other components within normal limits  COMPREHENSIVE METABOLIC PANEL - Abnormal; Notable for the following:    Potassium 3.4 (*)     Glucose, Bld 109 (*)     All other components within normal limits  LIPASE, BLOOD  URINALYSIS, ROUTINE W REFLEX MICROSCOPIC  GI PATHOGEN PANEL BY PCR, STOOL   No results found.   No diagnosis found.    MDM  Patient given IV fluids anti-medics. She was able to give a stool sample here. Suspect recurrent symptoms of from her  combination of IBS and side effects of her rheumatoid arthritis medications. Repeat abdominal exam at time of discharge is stable. She will followup with Dr.        Toy Baker, MD 06/23/12 2112

## 2012-06-23 NOTE — ED Notes (Signed)
Patient is alert and oriented x3.  She was given DC instructions and follow up visit instructions.  Patient gave verbal understanding. She was DC ambulatory under his own power to home.  V/S stable.  He was not showing any signs of distress on DC 

## 2013-08-19 IMAGING — CR DG PORTABLE PELVIS
1 series · 1 of 1 positions shown · non-contrast
Comparison: Intraoperative images.

CLINICAL DATA: Osteoarthritis of the right hip.  Status post right
total hip replacement.

PORTABLE PELVIS

[AP]
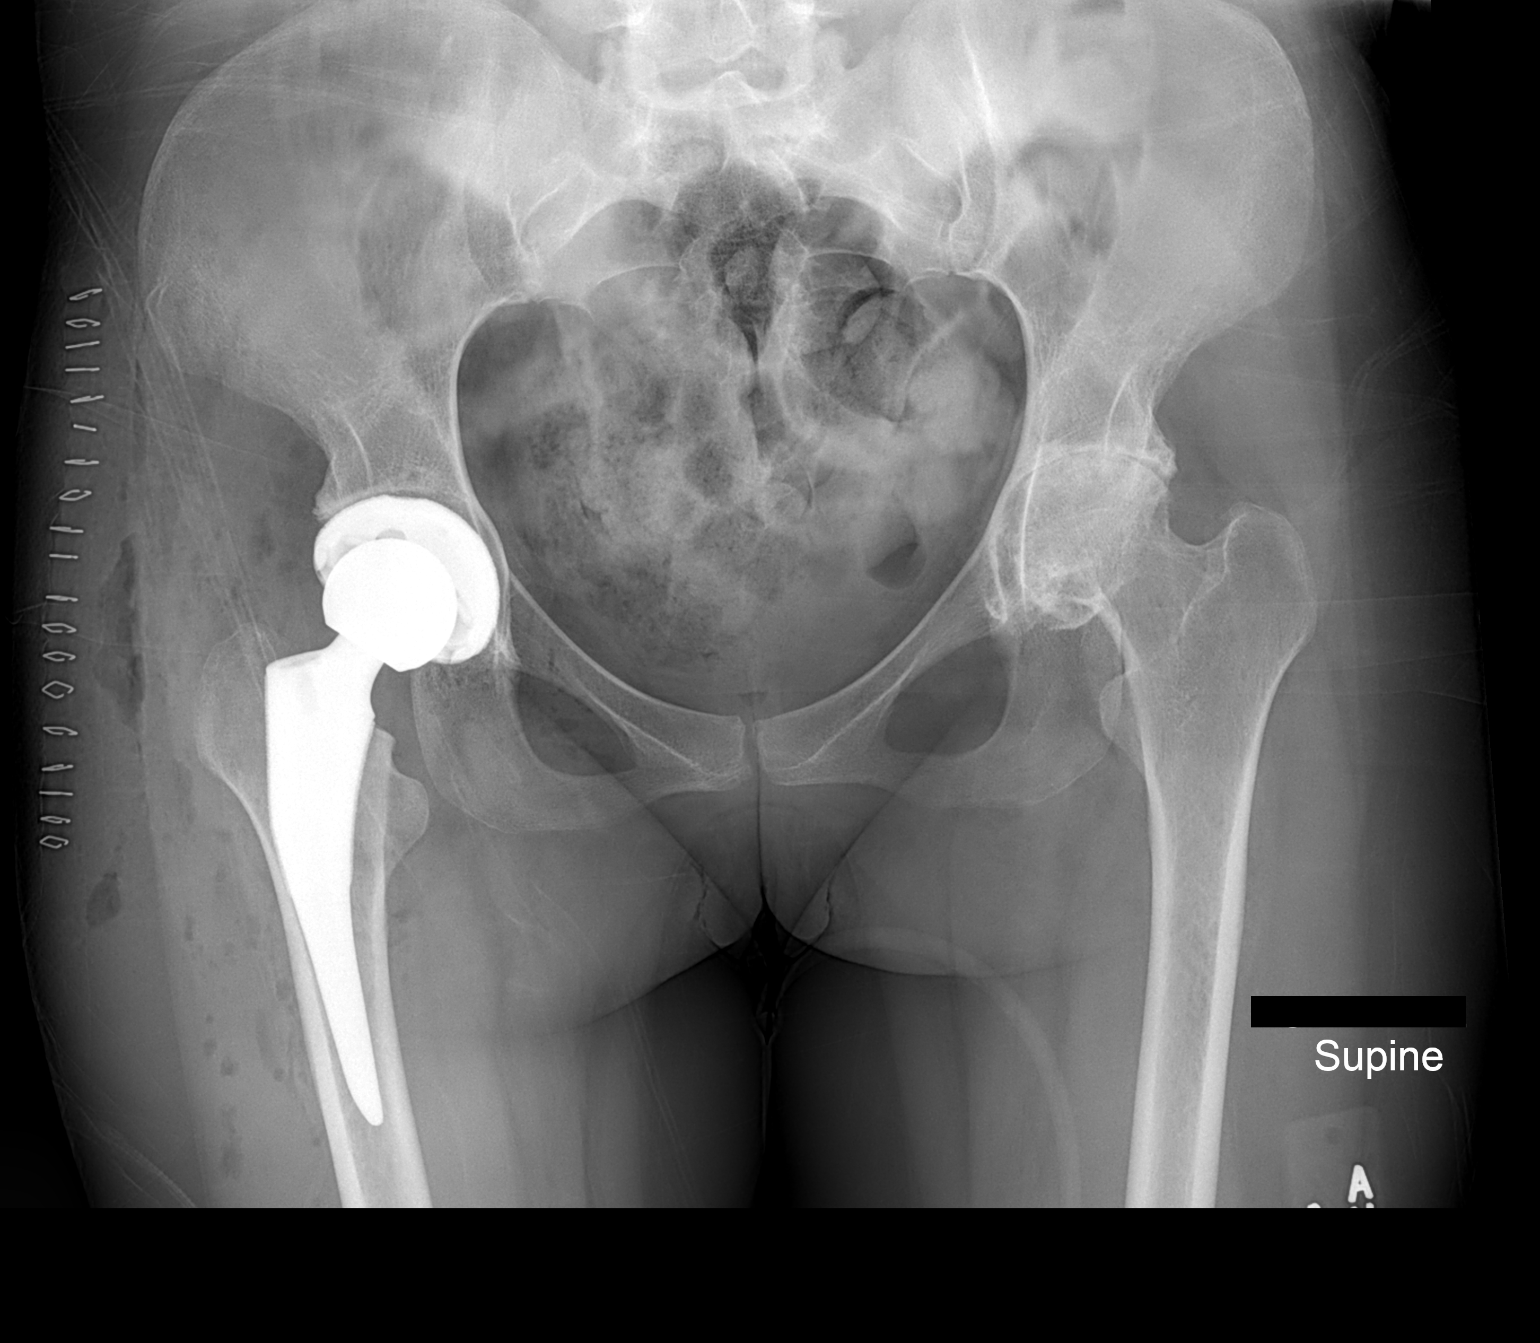

[1 of 1 positions shown; findings below may reference images not displayed]

FINDINGS: Acetabular and femoral components of the right total hip
prosthesis appear in excellent position.  No fractures.  Severe
osteoarthritis of the left hip is noted.
IMPRESSION: Satisfactory appearance of the right hip in the AP projection after
total hip prosthesis insertion.

## 2013-08-19 IMAGING — RF DG HIP COMPLETE 2+V*R*
1 series · 2 of 2 positions shown · non-contrast
Comparison: None.

CLINICAL DATA: Status post right hip replacement for arthritis.

RIGHT HIP - COMPLETE 2+ VIEW

[Series 1: run · 2 of 2 slices shown]
[im 1/2]
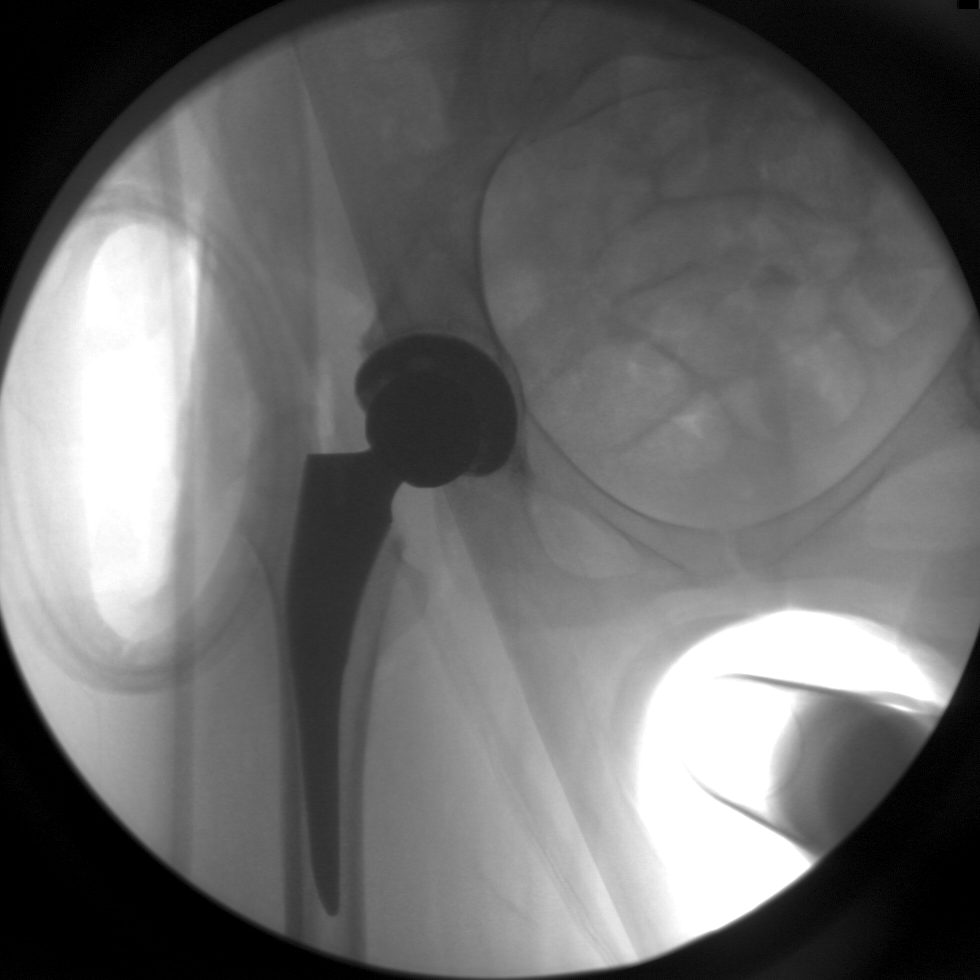
[im 2/2]
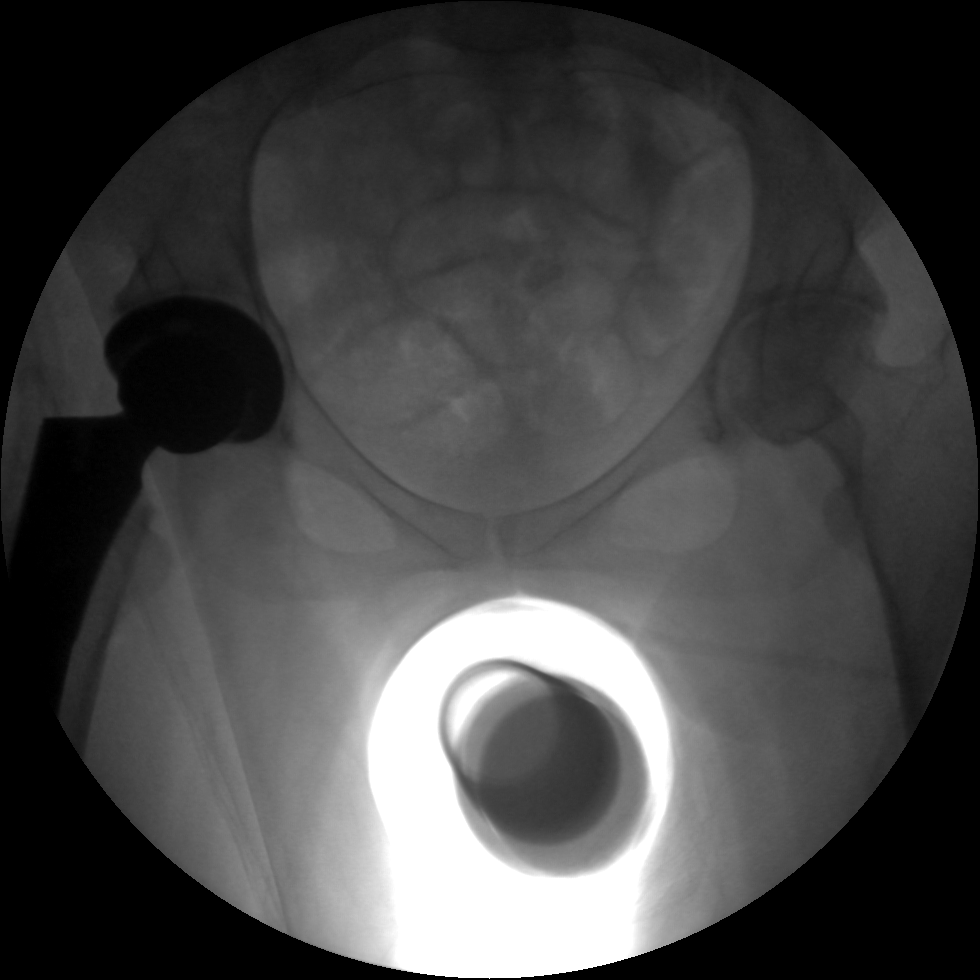

[2 of 2 positions shown; findings below may reference images not displayed]

FINDINGS: Two frontal C-arm views of the right hip demonstrate a
total hip prosthesis in satisfactory position and alignment.  No
fracture or dislocation is seen.
IMPRESSION: Satisfactory postoperative appearance of a right total hip
prosthesis.

## 2013-08-19 IMAGING — CR DG HIP 1V PORT*R*
1 series · 1 of 1 positions shown · non-contrast
Comparison: Intraoperative AP view dated 04/28/2012

CLINICAL DATA: Osteoarthritis of the right hip.  Status post right
total hip replacement.

PORTABLE RIGHT HIP - 1 VIEW

[AP]
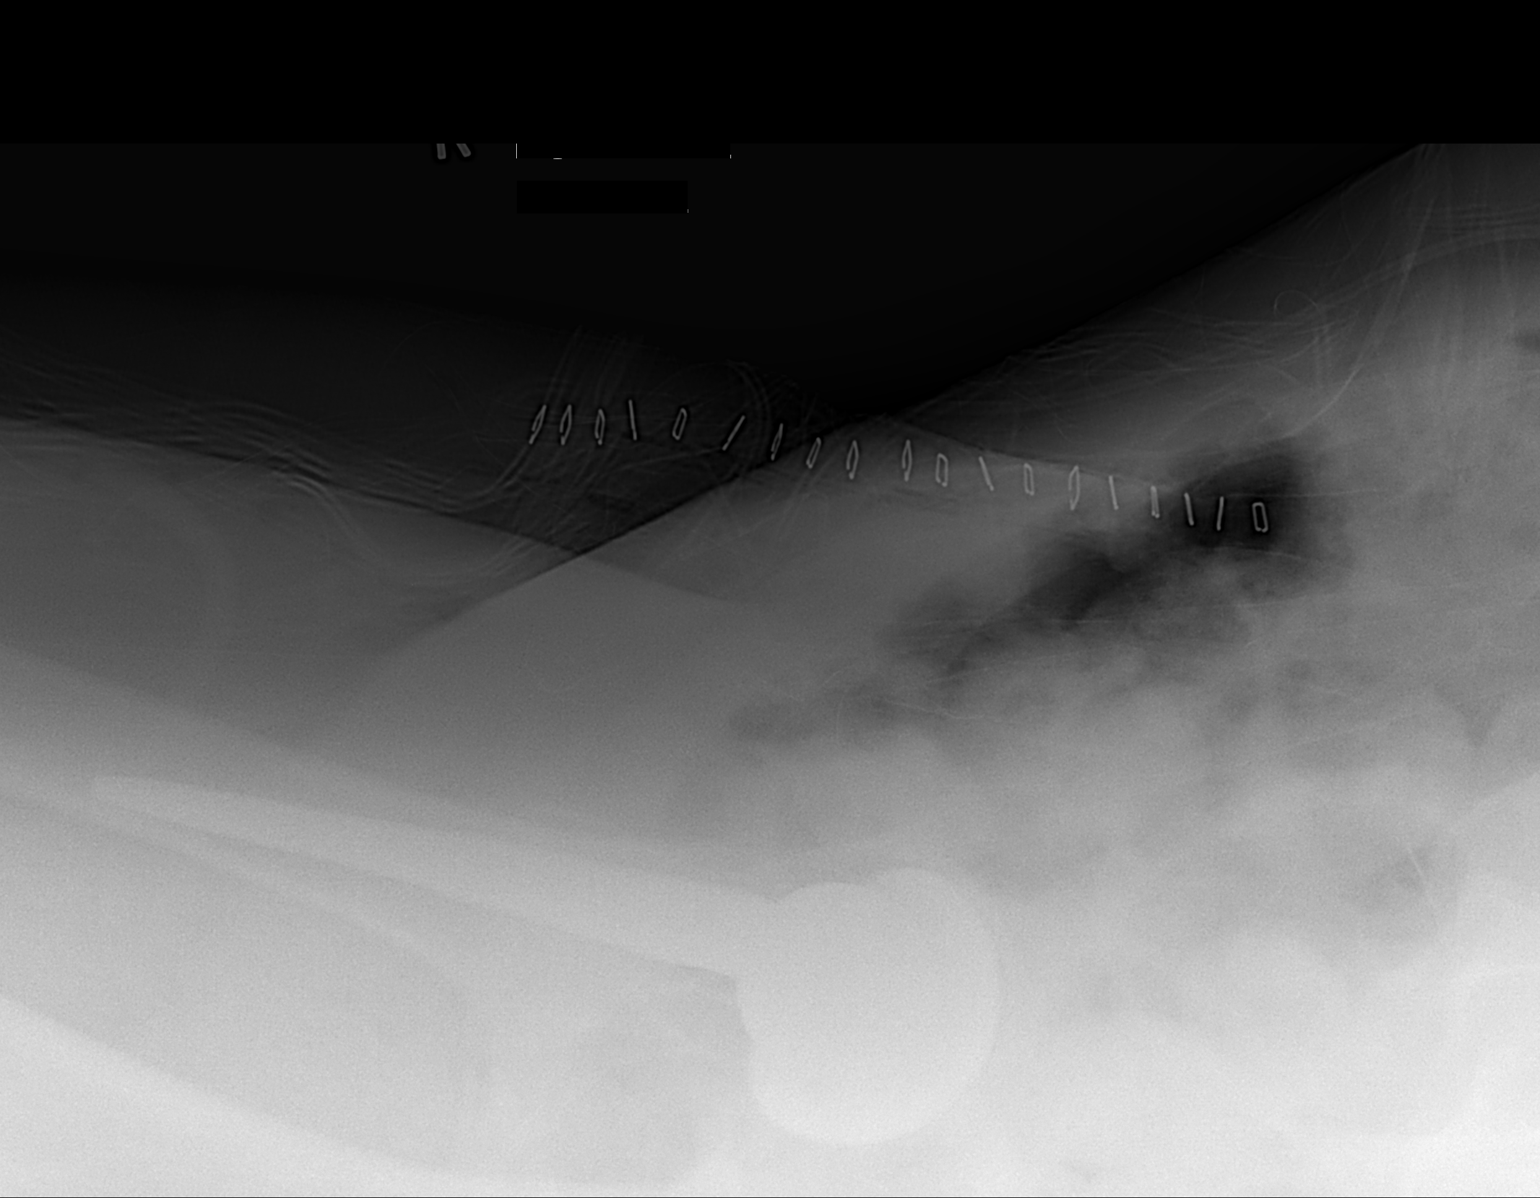

[1 of 1 positions shown; findings below may reference images not displayed]

FINDINGS: Lateral view of the hip demonstrates acetabular and
femoral components to be in excellent position.
IMPRESSION: Satisfactory appearance of the right hip in the lateral projection
after total hip replacement.

## 2015-12-22 ENCOUNTER — Other Ambulatory Visit: Payer: Self-pay | Admitting: Physician Assistant

## 2015-12-22 DIAGNOSIS — M25432 Effusion, left wrist: Secondary | ICD-10-CM

## 2015-12-25 ENCOUNTER — Other Ambulatory Visit: Payer: Self-pay | Admitting: Rheumatology

## 2015-12-25 DIAGNOSIS — M25432 Effusion, left wrist: Secondary | ICD-10-CM

## 2016-11-24 ENCOUNTER — Encounter (INDEPENDENT_AMBULATORY_CARE_PROVIDER_SITE_OTHER): Payer: Self-pay | Admitting: Physician Assistant

## 2016-11-24 ENCOUNTER — Ambulatory Visit (INDEPENDENT_AMBULATORY_CARE_PROVIDER_SITE_OTHER): Payer: Self-pay

## 2016-11-24 ENCOUNTER — Ambulatory Visit (INDEPENDENT_AMBULATORY_CARE_PROVIDER_SITE_OTHER): Payer: Medicaid Other

## 2016-11-24 ENCOUNTER — Ambulatory Visit (INDEPENDENT_AMBULATORY_CARE_PROVIDER_SITE_OTHER): Payer: Medicaid Other | Admitting: Physician Assistant

## 2016-11-24 DIAGNOSIS — M25551 Pain in right hip: Secondary | ICD-10-CM

## 2016-11-24 DIAGNOSIS — M25552 Pain in left hip: Secondary | ICD-10-CM | POA: Diagnosis not present

## 2016-11-24 NOTE — Progress Notes (Signed)
Office Visit Note   Patient: [name] [name]           Date of Birth: 09-14-1986           MRN: [id number] Visit Date: 11/24/2016              Requested by: No referring provider defined for this encounter. PCP: Pcp Not In System   Assessment & Plan: Visit Diagnoses:  1. Bilateral hip pain     Plan: IT band stretching exercises shown. May require a trochanteric injection if pain persists. She'll follow with [place] if pain persists or becomes worse.  Follow-Up Instructions: Return if symptoms worsen or fail to improve.   Orders:  Orders Placed This Encounter  Procedures  . XR HIP UNILAT W OR W/O PELVIS 2-3 VIEWS RIGHT  . XR HIP UNILAT W OR W/O PELVIS 2-3 VIEWS LEFT   No orders of the defined types were placed in this encounter.     Procedures: No procedures performed   Clinical Data: No additional findings.   Subjective: Chief Complaint  Patient presents with  . Right Hip - Pain  . Left Hip - Pain    HPI [name] comes in today with 3-4 week history of hip pain after she fell on a wet floor landing on the left hip. Now she is having increased right pain runs from the lateral aspect of the hip down to the knee. Some cramping she states it feels like a "pulled muscle". She denies any real radicular symptoms down either leg. No groin pain. She does note she is gaining some 20 pounds recently Review of Systems   Objective: Vital Signs: There were no vitals taken for this visit.  Physical Exam  Ortho Exam Hip she has excellent range of motion of both hips without pain. She has tenderness over the right trochanteric region and also over the distal IT band region. No tenderness over the left hip trochanteric region Specialty Comments:  No specialty comments available.  Imaging: Xr Hip Unilat W Or W/o Pelvis 2-3 Views Left  Result Date: 11/24/2016 AP pelvis: Bilateral hips well located no acute fractures. No status post bilateral total hip arthroplasties.  Xr Hip Unilat  W Or W/o Pelvis 2-3 Views Right  Result Date: 11/24/2016 [place]:  views bilateral hips : No pelvic fractures. Status post bilateral total hip arthroplasties without complication. Both hips are well located. No evidence of acetabular injury/fracture. SI joints  appear well maintained.    PMFS History: Patient Active Problem List   Diagnosis Date Noted  . 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 Degenerative arthritis of hip 04/28/2012  . IBS 01/22/2010  . ARTHRITIS, RHEUMATOID 02/24/2009  . DEPRESSION 01/31/2008  . ACNE 12/22/2006   Past Medical History:  Diagnosis Date  . Anemia    AND VIT B 12 DEFICIENCY--TAKES IRON PILL AS NEEDED  . Arthritis    RA since age 60  . Chronic night sweats   . Depression   . GERD (gastroesophageal reflux disease)    NO RECENT PROBLEMS--HAS LEARNED TO CONTROL WITH DIET  . Headache(784.0)    SHOOTING PAINS INSIDE OF HEAD--STATES SHE HAS HAD TESTING WHICH WAS NEGATIVE FOR ANY PROBLEMS.   NO RECENT HEAD PAINS  . IBS (irritable bowel syndrome)   . 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 Scoliosis     No family history on file.  Past Surgical  History:  Procedure Laterality Date  . LEFT THUMB SURGERY ATE 3 AFTER INJURY TO THE THUMB    . NASAL SEPTUM SURGERY  02/28/2011  . TOTAL HIP ARTHROPLASTY  04/28/2012   right hip  . TOTAL HIP ARTHROPLASTY  06/09/2012   Procedure: TOTAL HIP ARTHROPLASTY ANTERIOR APPROACH;  Surgeon: Kathryne Hitchhristopher Y Blackman, MD;  Location: [facility];  Service: Orthopedics;  Laterality: Left;  Left Total Hip Arthroplasty  . WISDOM TEETH EXTRACTED     Social History   Occupational History  . Not on file.   Social History Main Topics  . Smoking status: Never Smoker  . Smokeless tobacco: Never Used  . Alcohol use No  . Drug use: No  . Sexual activity: Not on file

## 2016-12-24 ENCOUNTER — Emergency Department (HOSPITAL_COMMUNITY)
Admission: EM | Admit: 2016-12-24 | Discharge: 2016-12-24 | Disposition: A | Discharge status: Home / Self Care | Payer: Medicaid Other | Attending: Emergency Medicine | Admitting: Emergency Medicine

## 2016-12-24 ENCOUNTER — Emergency Department (HOSPITAL_COMMUNITY): Payer: Medicaid Other

## 2016-12-24 ENCOUNTER — Encounter (HOSPITAL_COMMUNITY): Payer: Self-pay | Admitting: Emergency Medicine

## 2016-12-24 DIAGNOSIS — Z96643 Presence of artificial hip joint, bilateral: Secondary | ICD-10-CM | POA: Diagnosis not present

## 2016-12-24 DIAGNOSIS — Z79899 Other long term (current) drug therapy: Secondary | ICD-10-CM | POA: Diagnosis not present

## 2016-12-24 DIAGNOSIS — J189 Pneumonia, unspecified organism: Secondary | ICD-10-CM | POA: Diagnosis present

## 2016-12-24 DIAGNOSIS — J40 Bronchitis, not specified as acute or chronic: Secondary | ICD-10-CM | POA: Diagnosis not present

## 2016-12-24 MED ORDER — BENZONATATE 100 MG PO CAPS: 100.0000 mg | ORAL_CAPSULE | Freq: Three times a day (TID) | ORAL | 0 refills | Status: DC

## 2016-12-24 MED ORDER — AZITHROMYCIN 250 MG PO TABS: 250.0000 mg | ORAL_TABLET | Freq: Every day | ORAL | 0 refills | Status: DC

## 2016-12-24 MED ORDER — KETOROLAC TROMETHAMINE 30 MG/ML IJ SOLN: 30.0000 mg | Freq: Once | INTRAMUSCULAR | Status: DC

## 2016-12-24 MED ORDER — SODIUM CHLORIDE 0.9 % IV BOLUS (SEPSIS): 1000.0000 mL | Freq: Once | INTRAVENOUS | Status: DC

## 2016-12-24 NOTE — Discharge Instructions (Signed)
Return if any problems.  See your Physician for recheck next week,

## 2016-12-24 NOTE — ED Triage Notes (Signed)
Pt c/o coughing, fever, chills, and emesis that began a week ago; Pt takes methotrexate for rheumatoid arthritis and gets Pna frequently as a result; pt also has hoarse, quiet voice that she says is from her throat swelling which occurs when she has pneumonia; no respiratory distress at present

## 2016-12-24 NOTE — ED Provider Notes (Signed)
[facility]ER[facility]ENCY DEPT Provider Note   CSN: [id number] Arrival date & time: 12/24/16  0351     History   Chief Complaint Chief Complaint  Patient presents with  . Pneumonia    HPI [name] [name] is a 31 y.o. female.  The history is provided by the patient. No language interpreter was used.  Pneumonia  This is a new problem. The problem occurs constantly. The problem has been gradually worsening. Nothing aggravates the symptoms. Nothing relieves the symptoms. She has tried nothing for the symptoms. The treatment provided moderate relief.  Pt is concerned that she has pneumonia.  Pt has had a cough for a week.  Pt complains of sores in her mouth.  Pt reports this happens when she gets pneumonia.  Past Medical History:  Diagnosis Date  . Anemia    AND VIT B 12 DEFICIENCY--TAKES IRON PILL AS NEEDED  . Arthritis    RA since age 31  . Chronic night sweats   . Depression   . GERD (gastroesophageal reflux disease)    NO RECENT PROBLEMS--HAS LEARNED TO CONTROL WITH DIET  . Headache(784.0)    SHOOTING PAINS INSIDE OF HEAD--STATES SHE HAS HAD TESTING WHICH WAS NEGATIVE FOR ANY PROBLEMS.   NO RECENT HEAD PAINS  . IBS (irritable bowel syndrome)   . 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 Scoliosis     Patient Active Problem List   Diagnosis Date Noted  . 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 Degenerative arthritis of hip 04/28/2012  . IBS 01/22/2010  . ARTHRITIS, RHEUMATOID 02/24/2009  . DEPRESSION 01/31/2008  . ACNE 12/22/2006    Past Surgical History:  Procedure Laterality Date  . LEFT THUMB SURGERY ATE 3 AFTER INJURY TO THE THUMB    . NASAL SEPTUM SURGERY  02/28/2011  . TOTAL HIP ARTHROPLASTY  04/28/2012   right hip  . TOTAL HIP ARTHROPLASTY  06/09/2012   Procedure: TOTAL HIP ARTHROPLASTY ANTERIOR APPROACH;  Surgeon: Kathryne Hitchhristopher Y Blackman, MD;  Location: [facility];  Service: Orthopedics;  Laterality: Left;   Left Total Hip Arthroplasty  . TOTAL HIP ARTHROPLASTY    . WISDOM TEETH EXTRACTED      OB History    No data available       Home Medications    Prior to Admission medications   Medication Sig Start Date End Date Taking? Authorizing Provider  adalimumab (HUMIRA) 40 MG/0.8ML injection Inject 40 mg into the skin every 7 (seven) days. On Saturdays    Historical Provider, MD  Calcium Carbonate-Vitamin D (CALTRATE 600+D) 600-400 MG-UNIT per tablet Take 1 tablet by mouth 2 (two) times daily.     Historical Provider, MD  DULoxetine (CYMBALTA) 30 MG capsule Take 30 mg by mouth 2 (two) times daily.    Historical Provider, MD  ferrous sulfate 325 (65 FE) MG tablet Take 325 mg by mouth daily as needed. As needed when patient feels she needs it.    Historical Provider, MD  folic acid (FOLVITE) 1 MG tablet Take 1 mg by mouth 2 (two) times daily.    Historical Provider, MD  HYDROcodone-acetaminophen (VICODIN) 5-500 MG per tablet Take 1 tablet by mouth every 4 (four) hours as needed. For pain Patient not taking: Reported on 11/24/2016 04/30/12   Kathryne Hitchhristopher Y Blackman, MD  hydroxychloroquine (PLAQUENIL) 200 MG tablet Take 200 mg by mouth 2 (two) times daily.    Historical Provider, MD  methocarbamol (ROBAXIN) 500 MG tablet Take 500 mg by mouth 4 (four) times daily as needed. Muscle spasms    Historical Provider, MD  methotrexate (RHEUMATREX) 2.5 MG tablet Take 20 mg by mouth once a week. On Saturdays    Historical Provider, MD    Family History No family history on file.  Social History Social History  Substance Use Topics  . Smoking status: Never Smoker  . Smokeless tobacco: Never Used  . Alcohol use No     Allergies   Other   Review of Systems Review of Systems  Respiratory: Positive for cough.   All other systems reviewed and are negative.    Physical Exam Updated Vital Signs BP 115/85 (BP Location: Right Arm)   Pulse 86   Temp 98.9 F (37.2 C) (Oral)   Resp 18   LMP  12/23/2016 (Approximate)   SpO2 100%   Physical Exam  Constitutional: She is oriented to person, place, and time. She appears well-developed and well-nourished.  HENT:  Head: Normocephalic and atraumatic.  Right Ear: External ear normal.  Left Ear: External ear normal.  Nose: Nose normal.  Ulcers mouth   Eyes: EOM are normal. Pupils are equal, round, and reactive to light.  Neck: Normal range of motion. Neck supple.  Cardiovascular: Normal rate, regular rhythm, normal heart sounds and intact distal pulses.   Pulmonary/Chest: Effort normal and breath sounds normal.  Abdominal: Soft.  Musculoskeletal: Normal range of motion.  Neurological: She is alert and oriented to person, place, and time.  Skin: Skin is warm.  Psychiatric: She has a normal mood and affect.     ED Treatments / Results  Labs (all labs ordered are listed, but only abnormal results are displayed) Labs Reviewed - No data to display  EKG  EKG Interpretation None       Radiology Dg Chest 2 View  Result Date: 12/24/2016 CLINICAL DATA:  Worsening cough. EXAM: CHEST  2 VIEW COMPARISON:  12/19/2009. FINDINGS: Mediastinum hilar structures normal. Low lung volumes. No focal pulmonary infiltrate. Mild peribronchial cuffing. Mild bronchitis cannot be excluded . No pleural effusion or pneumothorax. Heart size normal. No acute bony abnormality. IMPRESSION: Low lung volumes. No focal pulmonary infiltrate . Mild peribronchial cuffing. Mild bronchitis cannot be excluded. Electronically Signed   By: Maisie Fus  Register   On: 12/24/2016 07:27    Procedures Procedures (including critical care time)  Medications Ordered in ED Medications - No data to display   Initial Impression / Assessment and Plan / ED Course  I have reviewed the triage vital signs and the nursing notes.  Pertinent labs & imaging results that were available during my care of the patient were reviewed by me and considered in my medical decision making (see  chart for details).     Chest xray shows mild bronchitis.  No evidence of pneumonia.  I will treat pt with with zithromax and tessalon.  Pt is advised to see her MD for recheck  Final Clinical Impressions(s) / ED Diagnoses   Final diagnoses:  Bronchitis    New Prescriptions New Prescriptions   AZITHROMYCIN (ZITHROMAX) 250 MG TABLET    Take 1 tablet (250 mg total) by mouth daily. Take first 2 tablets together, then 1 every day until finished.   BENZONATATE (TESSALON) 100 MG CAPSULE    Take 1 capsule (100 mg total) by mouth every 8 (eight) hours.     Lonia Skinner [place], PA-C 12/24/16 0845    Jerelyn [name], MD 12/24/16 [phone]

## 2017-01-10 ENCOUNTER — Ambulatory Visit (INDEPENDENT_AMBULATORY_CARE_PROVIDER_SITE_OTHER): Payer: Medicaid Other | Admitting: Physician Assistant

## 2017-01-10 ENCOUNTER — Ambulatory Visit (INDEPENDENT_AMBULATORY_CARE_PROVIDER_SITE_OTHER): Payer: Medicaid Other

## 2017-01-10 DIAGNOSIS — M5442 Lumbago with sciatica, left side: Secondary | ICD-10-CM | POA: Diagnosis not present

## 2017-01-10 DIAGNOSIS — G8929 Other chronic pain: Secondary | ICD-10-CM | POA: Diagnosis not present

## 2017-01-10 DIAGNOSIS — M5441 Lumbago with sciatica, right side: Secondary | ICD-10-CM | POA: Diagnosis not present

## 2017-01-10 NOTE — Progress Notes (Signed)
Office Visit Note   Patient: [name] [name]           Date of Birth: 02/24/1986           MRN: [id number] Visit Date: 01/10/2017              Requested by: No referring provider defined for this encounter. PCP: Pcp Not In System   Assessment & Plan: Visit Diagnoses:  1. Chronic bilateral low back pain with bilateral sciatica     Plan: Spoke with [name] [name] at length that I do not feel this is most likely coming from [name] [name] back. Needs to follow with [name] [name] primary care physician at this point in time for possible labs and possible referral to neurology.  Follow-Up Instructions: Return if symptoms worsen or fail to improve.   Orders:  Orders Placed This Encounter  Procedures  . XR Lumbar Spine 2-3 Views   No orders of the defined types were placed in this encounter.     Procedures: No procedures performed   Clinical Data: No additional findings.   Subjective: Chief Complaint  Patient presents with  . Left Hip - Follow-up  . Right Hip - Follow-up  . Lower Back - Pain, Numbness, Weakness    Patient is here today following up on [name] [name] hips. She continues to state she has numbness and tingling in both of [name] [name] legs with walking. She states they get weak and she can't walk at times. She also states she just recently went to [name] [name] rheumatologist and she told [name] [name] to FU here with ortho for this issue. She states that she first experienced weakness and could not walk in 2010 . She was found to be anemic. Also had a history of vitamin B12 deficiency. She describes the episodes of numbness tingling down [name] [name] legs to be periodic and associated with itching sensation in legs. States the pain and numbness is worse with activity.   Weakness  Associated symptoms include weakness.    Review of Systems  Neurological: Positive for weakness.  Positive for numbness tingling down both legs. Otherwise see history of present illness  Objective: Vital Signs: LMP 12/23/2016 (Approximate)   Physical Exam   Constitutional: She is oriented to person, place, and time. She appears well-developed and well-nourished. No distress.  Cardiovascular: Intact distal pulses.   Pulmonary/Chest: Effort normal.  Neurological: She is alert and oriented to person, place, and time.  Psychiatric: She has a normal mood and affect. [name] [name] behavior is normal.    Back Exam   Tenderness  The patient is experiencing tenderness in the lumbar.  Range of Motion  Extension: normal  Flexion: normal   Muscle Strength  Right Quadriceps:  5/5  Left Quadriceps:  5/5  Right Hamstrings:  5/5  Left Hamstrings:  5/5   Tests  Straight leg raise right: negative Straight leg raise left: negative  Reflexes  Patellar: normal Achilles: normal  Comments:  Good Range of motion of both hips without pain      Specialty Comments:  No specialty comments available.  Imaging: Xr Lumbar Spine 2-3 Views  Result Date: 01/10/2017 Lumbar spine 2 views: No acute fracture. Disc space well-maintained. No spinal listhesis no bony abnormalities.    PMFS History: Patient Active Problem List   Diagnosis Date Noted  . 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 Degenerative arthritis of hip 04/28/2012  . IBS 01/22/2010  . ARTHRITIS, RHEUMATOID 02/24/2009  . DEPRESSION 01/31/2008  . ACNE 12/22/2006  Past Medical History:  Diagnosis Date  . Anemia    AND VIT B 12 DEFICIENCY--TAKES IRON PILL AS NEEDED  . Arthritis    RA since age 80  . Chronic night sweats   . Depression   . GERD (gastroesophageal reflux disease)    NO RECENT PROBLEMS--HAS LEARNED TO CONTROL WITH DIET  . Headache(784.0)    SHOOTING PAINS INSIDE OF HEAD--STATES SHE HAS HAD TESTING WHICH WAS NEGATIVE FOR ANY PROBLEMS.   NO RECENT HEAD PAINS  . IBS (irritable bowel syndrome)   . 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 Scoliosis     No family history on file.  Past Surgical History:   Procedure Laterality Date  . LEFT THUMB SURGERY ATE 3 AFTER INJURY TO THE THUMB    . NASAL SEPTUM SURGERY  02/28/2011  . TOTAL HIP ARTHROPLASTY  04/28/2012   right hip  . TOTAL HIP ARTHROPLASTY  06/09/2012   Procedure: TOTAL HIP ARTHROPLASTY ANTERIOR APPROACH;  Surgeon: Kathryne Hitch, MD;  Location: [facility];  Service: Orthopedics;  Laterality: Left;  Left Total Hip Arthroplasty  . TOTAL HIP ARTHROPLASTY    . WISDOM TEETH EXTRACTED     Social History   Occupational History  . Not on file.   Social History Main Topics  . Smoking status: Never Smoker  . Smokeless tobacco: Never Used  . Alcohol use No  . Drug use: No  . Sexual activity: Not on file

## 2017-01-24 ENCOUNTER — Ambulatory Visit (HOSPITAL_COMMUNITY)
Admission: EM | Admit: 2017-01-24 | Discharge: 2017-01-24 | Disposition: A | Discharge status: Home / Self Care | Payer: Medicaid Other | Attending: Family Medicine | Admitting: Family Medicine

## 2017-01-24 ENCOUNTER — Encounter (HOSPITAL_COMMUNITY): Payer: Self-pay | Admitting: Emergency Medicine

## 2017-01-24 DIAGNOSIS — L989 Disorder of the skin and subcutaneous tissue, unspecified: Secondary | ICD-10-CM | POA: Diagnosis not present

## 2017-01-24 DIAGNOSIS — R590 Localized enlarged lymph nodes: Secondary | ICD-10-CM

## 2017-01-24 NOTE — ED Triage Notes (Signed)
Noticed a soft, painful knot on left side of neck, behind left ear.  Visible knot on top of shoulder/along clavicle.

## 2017-01-24 NOTE — Discharge Instructions (Signed)
At this time the lymph node at the lower portion of the left side of the neck is small and isolated as the only one that can be felt. I did not see any other infection or problems that would calls this to be enlarged, likely benign and may be associated with your immune system. The small bump, lesion behind her ear is likely due to the side effect from your methotrexate.

## 2017-01-24 NOTE — ED Provider Notes (Signed)
CSN: [id number]     Arrival date & time 01/24/17  1220 History   First MD Initiated Contact with Patient 01/24/17 1341     Chief Complaint  Patient presents with  . Lymphadenopathy   (Consider location/radiation/quality/duration/timing/severity/associated sxs/prior Treatment) 31 year old female presents to the urgent care with a concern of a small red tender lesion to the scalp just behind the left ear. Second concern is that of the small minimally tender nodule at the base of the left anterolateral neck. She states she feels well in general, no fever, sore throat, earache or other symptoms other than PND. Patient is concerned because she takes methotrexate. She has a history of having small skin lesions which are often tender and red as a side effect of the methotrexate. She has rheumatoid arthritis which has been severe in the past to include complications of bilateral hip degeneration and hip replacement surgery.      Past Medical History:  Diagnosis Date  . Anemia    AND VIT B 12 DEFICIENCY--TAKES IRON PILL AS NEEDED  . Arthritis    RA since age 89  . Chronic night sweats   . Depression   . GERD (gastroesophageal reflux disease)    NO RECENT PROBLEMS--HAS LEARNED TO CONTROL WITH DIET  . Headache(784.0)    SHOOTING PAINS INSIDE OF HEAD--STATES SHE HAS HAD TESTING WHICH WAS NEGATIVE FOR ANY PROBLEMS.   NO RECENT HEAD PAINS  . IBS (irritable bowel syndrome)   . 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 Scoliosis    Past Surgical History:  Procedure Laterality Date  . LEFT THUMB SURGERY ATE 3 AFTER INJURY TO THE THUMB    . NASAL SEPTUM SURGERY  02/28/2011  . TOTAL HIP ARTHROPLASTY  04/28/2012   right hip  . TOTAL HIP ARTHROPLASTY  06/09/2012   Procedure: TOTAL HIP ARTHROPLASTY ANTERIOR APPROACH;  Surgeon: Kathryne Hitch, MD;  Location: [facility];  Service: Orthopedics;  Laterality: Left;  Left Total Hip Arthroplasty  . TOTAL HIP  ARTHROPLASTY    . WISDOM TEETH EXTRACTED     No family history on file. Social History  Substance Use Topics  . Smoking status: Never Smoker  . Smokeless tobacco: Never Used  . Alcohol use No   OB History    No data available     Review of Systems  Constitutional: Negative.   HENT: Positive for postnasal drip.   Respiratory: Negative.   Skin: Negative.   Neurological: Negative.   All other systems reviewed and are negative.   Allergies  Other  Home Medications   Prior to Admission medications   Medication Sig Start Date End Date Taking? Authorizing Provider  adalimumab (HUMIRA) 40 MG/0.8ML injection Inject 40 mg into the skin every 7 (seven) days. On Saturdays    Historical Provider, MD  Calcium Carbonate-Vitamin D (CALTRATE 600+D) 600-400 MG-UNIT per tablet Take 1 tablet by mouth 2 (two) times daily.     Historical Provider, MD  DULoxetine (CYMBALTA) 30 MG capsule Take 30 mg by mouth 2 (two) times daily.    Historical Provider, MD  ferrous sulfate 325 (65 FE) MG tablet Take 325 mg by mouth daily as needed. As needed when patient feels she needs it.    Historical Provider, MD  folic acid (FOLVITE) 1 MG tablet Take 1 mg by mouth 2 (two) times daily.    Historical Provider, MD  hydroxychloroquine (PLAQUENIL) 200 MG tablet Take 200  mg by mouth 2 (two) times daily.    Historical Provider, MD  methocarbamol (ROBAXIN) 500 MG tablet Take 500 mg by mouth 4 (four) times daily as needed. Muscle spasms    Historical Provider, MD  methotrexate (RHEUMATREX) 2.5 MG tablet Take 20 mg by mouth once a week. On Saturdays    Historical Provider, MD  methotrexate (RHEUMATREX) 2.5 MG tablet TAKE 10 TABLETS BY MOUTH ONCE A WEEK ONCE WEEKLY 12/17/16   Historical Provider, MD   Meds Ordered and Administered this Visit  Medications - No data to display  BP 123/82 (BP Location: Right Arm)   Pulse 73   Temp 98.8 F (37.1 C) (Oral)   Resp 14   LMP 01/17/2017   SpO2 100%  No data  found.   Physical Exam  Constitutional: She is oriented to person, place, and time. She appears well-developed and well-nourished.  HENT:  Right Ear: External ear normal.  Left Ear: External ear normal.  Mouth/Throat: No oropharyngeal exudate.  Oropharynx with minor erythema, cobblestoning and clear PND.  Eyes: EOM are normal.  Neck: Normal range of motion. Neck supple.  There is a small node approximately 6-7 mm in diameter located to the base of the left lateral cervical chain. Minimally tender. No overlying discoloration. It is mobile. There is an identical lesion to the right side of the neck but just a little smaller. No overlying skin changes, discoloration.  Pulmonary/Chest: Effort normal. No respiratory distress.  Musculoskeletal: Normal range of motion.  Neurological: She is alert and oriented to person, place, and time.  Skin: Skin is warm and dry. She is not diaphoretic.  The lesion to the left scalp just behind the ear is a small raised red rough area similar to that in which she has had before. Does not look infectious. It is similar to the lesion on her right cheek. No surrounding lymphadenopathy. No drainage, bleeding or exudate.  Psychiatric: She has a normal mood and affect.  Nursing note and vitals reviewed.   Urgent Care Course     Procedures (including critical care time)  Labs Review Labs Reviewed - No data to display  Imaging Review No results found.   Visual Acuity Review  Right Eye Distance:   Left Eye Distance:   Bilateral Distance:    Right Eye Near:   Left Eye Near:    Bilateral Near:         MDM   1. Enlarged lymph node in neck   2. Skin lesion of scalp    At this time the lymph node at the lower portion of the left side of the neck is small and isolated as the only one that can be felt. I did not see any other infection or problems that would calls this to be enlarged, likely benign and may be associated with your immune system. The  small bump, lesion behind her ear is likely due to the side effect from your methotrexate. Reassurance. Patient with history of severe rheumatoid arthritis and treated with methotrexate. She has no acute illness symptoms today.    Hayden Rasmussen, NP 01/24/17 1407

## 2021-07-12 ENCOUNTER — Other Ambulatory Visit: Payer: Self-pay

## 2021-07-12 ENCOUNTER — Emergency Department (HOSPITAL_COMMUNITY)
Admission: EM | Admit: 2021-07-12 | Discharge: 2021-07-12 | Disposition: A | Discharge status: Home / Self Care | Payer: Medicaid - Out of State | Attending: Emergency Medicine | Admitting: Emergency Medicine

## 2021-07-12 ENCOUNTER — Encounter (HOSPITAL_COMMUNITY): Payer: Self-pay | Admitting: Emergency Medicine

## 2021-07-12 DIAGNOSIS — R112 Nausea with vomiting, unspecified: Secondary | ICD-10-CM | POA: Insufficient documentation

## 2021-07-12 DIAGNOSIS — Z96643 Presence of artificial hip joint, bilateral: Secondary | ICD-10-CM | POA: Insufficient documentation

## 2021-07-12 HISTORY — DX: Unspecified juvenile rheumatoid arthritis of unspecified site: M08.00

## 2021-07-12 LAB — CBC WITH DIFFERENTIAL/PLATELET
Abs Immature Granulocytes: 0.06 10*3/uL (ref 0.00–0.07)
Basophils Absolute: 0.1 10*3/uL (ref 0.0–0.1)
Basophils Relative: 1 %
Eosinophils Absolute: 0 10*3/uL (ref 0.0–0.5)
Eosinophils Relative: 0 %
HCT: 43.7 % (ref 36.0–46.0)
Hemoglobin: 14.5 g/dL (ref 12.0–15.0)
Immature Granulocytes: 1 %
Lymphocytes Relative: 4 %
Lymphs Abs: 0.4 10*3/uL — ABNORMAL LOW (ref 0.7–4.0)
MCH: 31 pg (ref 26.0–34.0)
MCHC: 33.2 g/dL (ref 30.0–36.0)
MCV: 93.6 fL (ref 80.0–100.0)
Monocytes Absolute: 0.6 10*3/uL (ref 0.1–1.0)
Monocytes Relative: 5 %
Neutro Abs: 10 10*3/uL — ABNORMAL HIGH (ref 1.7–7.7)
Neutrophils Relative %: 89 %
Platelets: 284 10*3/uL (ref 150–400)
RBC: 4.67 MIL/uL (ref 3.87–5.11)
RDW: 12.8 % (ref 11.5–15.5)
WBC: 11.1 10*3/uL — ABNORMAL HIGH (ref 4.0–10.5)
nRBC: 0 % (ref 0.0–0.2)

## 2021-07-12 LAB — I-STAT BETA HCG BLOOD, ED (MC, WL, AP ONLY): I-stat hCG, quantitative: <5 m[IU]/mL (ref <5)

## 2021-07-12 LAB — BASIC METABOLIC PANEL
Anion gap: 10 (ref 5–15)
BUN: 13 mg/dL (ref 6–20)
CO2: 22 mmol/L (ref 22–32)
Calcium: 9 mg/dL (ref 8.9–10.3)
Chloride: 107 mmol/L (ref 98–111)
Creatinine, Ser: 0.66 mg/dL (ref 0.44–1.00)
GFR, Estimated: >60 mL/min (ref >60)
Glucose, Bld: 109 mg/dL — ABNORMAL HIGH (ref 70–99)
Potassium: 3.6 mmol/L (ref 3.5–5.1)
Sodium: 139 mmol/L (ref 135–145)

## 2021-07-12 LAB — LIPASE, BLOOD: Lipase: 26 U/L (ref 11–51)

## 2021-07-12 MED ORDER — SODIUM CHLORIDE 0.9 % IV BOLUS
1000.0000 mL | Freq: Once | INTRAVENOUS | Status: AC
  Administered 2021-07-12: 1000 mL via INTRAVENOUS

## 2021-07-12 MED ORDER — METOCLOPRAMIDE HCL 5 MG/ML IJ SOLN
10.0000 mg | Freq: Once | INTRAMUSCULAR | Status: AC
  Administered 2021-07-12: 10 mg via INTRAVENOUS
  Filled 2021-07-12: qty 2

## 2021-07-12 MED ORDER — DIPHENHYDRAMINE HCL 50 MG/ML IJ SOLN
25.0000 mg | Freq: Once | INTRAMUSCULAR | Status: AC
Start: 2021-07-12 — End: 2021-07-12
  Administered 2021-07-12: 25 mg via INTRAVENOUS
  Filled 2021-07-12: qty 1

## 2021-07-12 MED ORDER — LORAZEPAM 2 MG/ML IJ SOLN
1.0000 mg | Freq: Once | INTRAMUSCULAR | Status: AC
  Administered 2021-07-12: 1 mg via INTRAVENOUS
  Filled 2021-07-12: qty 1

## 2021-07-12 MED ORDER — METOCLOPRAMIDE HCL 10 MG PO TABS: 10.0000 mg | ORAL_TABLET | Freq: Four times a day (QID) | ORAL | 0 refills | Status: AC

## 2021-07-12 NOTE — ED Provider Notes (Signed)
[facility]-EMERGENCY DEPT Provider Note   CSN: [id number] Arrival date & time: 07/12/21  2025     History Chief Complaint  Patient presents with   Vomiting    [name] [name] is a 35 y.o. female.  Patient presents to the emergency department with a chief complaint of nausea and vomiting.  She reports that she traveled here from [place].  Has history of narcolepsy and IBS.  States she has had persistent vomiting over the past 2 days.  She attributes this to not having slept.  She denies fevers or chills.  Denies any successful treatments prior to arrival.  Denies any other associated symptoms.  The history is provided by the patient. No language interpreter was used.      Past Medical History:  Diagnosis Date   Anemia    AND VIT B 12 DEFICIENCY--TAKES IRON PILL AS NEEDED   Chronic night sweats    Depression    GERD (gastroesophageal reflux disease)    NO RECENT PROBLEMS--HAS LEARNED TO CONTROL WITH DIET   Headache(784.0)    SHOOTING PAINS INSIDE OF HEAD--STATES SHE HAS HAD TESTING WHICH WAS NEGATIVE FOR ANY PROBLEMS.   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) 06/09/2012   Degenerative arthritis of hip 04/28/2012   IBS 01/22/2010   ARTHRITIS, RHEUMATOID 02/24/2009   DEPRESSION 01/31/2008   ACNE 12/22/2006    Past Surgical History:  Procedure Laterality Date   Left thumb surgery after injury     NASAL SEPTUM SURGERY  02/28/2011   TOTAL HIP ARTHROPLASTY  04/28/2012   right hip   TOTAL HIP ARTHROPLASTY  06/09/2012   Procedure: TOTAL HIP ARTHROPLASTY ANTERIOR APPROACH;  Surgeon: Kathryne Hitch, MD;  Location: [facility];  Service: Orthopedics;  Laterality: Left;  Left Total Hip  Arthroplasty   WISDOM TEETH EXTRACTED       OB History   No obstetric history on file.     History reviewed. No pertinent family history.  Social History   Tobacco Use   Smoking status: Never   Smokeless tobacco: Never  Substance Use Topics   Alcohol use: No   Drug use: No    Home Medications Prior to Admission medications   Medication Sig Start Date End Date Taking? Authorizing Provider  adalimumab (HUMIRA) 40 MG/0.8ML injection Inject 40 mg into the skin every 7 (seven) days. On Saturdays    [provider]  Calcium Carbonate-Vitamin D (CALTRATE 600+D) 600-400 MG-UNIT per tablet Take 1 tablet by mouth 2 (two) times daily.     [provider]  DULoxetine (CYMBALTA) 30 MG capsule Take 30 mg by mouth 2 (two) times daily.    [provider]  ferrous sulfate 325 (65 FE) MG tablet Take 325 mg by mouth daily as needed. As needed when patient feels she needs it.    [provider]  folic acid (FOLVITE) 1 MG tablet Take 1 mg by mouth 2 (two) times daily.    [provider]  hydroxychloroquine (PLAQUENIL) 200 MG tablet Take 200 mg by mouth 2 (two) times daily.    [provider]  methocarbamol (ROBAXIN) 500 MG tablet Take 500 mg by mouth  4 (four) times daily as needed. Muscle spasms    [provider]  methotrexate (RHEUMATREX) 2.5 MG tablet Take 20 mg by mouth once a week. On Saturdays    [provider]  methotrexate (RHEUMATREX) 2.5 MG tablet TAKE 10 TABLETS BY MOUTH ONCE A WEEK ONCE WEEKLY 12/17/16   [provider]    Allergies    Other and Zofran [ondansetron]  Review of Systems   Review of Systems  All other systems reviewed and are negative.  Physical Exam Updated Vital Signs BP 128/86 (BP Location: Left Arm)   Pulse (!) 102   Temp 97.8 F (36.6 C) (Oral)   Resp 18   Ht 5\' 3"  (1.6 m)   Wt 63.5 kg   SpO2 100%   BMI 24.80 kg/m   Physical Exam Vitals and nursing note reviewed.   Constitutional:      General: She is not in acute distress.    Appearance: She is well-developed.  HENT:     Head: Normocephalic and atraumatic.  Eyes:     Conjunctiva/sclera: Conjunctivae normal.  Cardiovascular:     Rate and Rhythm: Normal rate and regular rhythm.     Heart sounds: No murmur heard. Pulmonary:     Effort: Pulmonary effort is normal. No respiratory distress.     Breath sounds: Normal breath sounds.  Abdominal:     Palpations: Abdomen is soft.     Tenderness: There is no abdominal tenderness.  Musculoskeletal:        General: Normal range of motion.     Cervical back: Neck supple.  Skin:    General: Skin is warm and dry.  Neurological:     Mental Status: She is alert and oriented to person, place, and time.  Psychiatric:        Mood and Affect: Mood normal.        Behavior: Behavior normal.    ED Results / Procedures / Treatments   Labs (all labs ordered are listed, but only abnormal results are displayed) Labs Reviewed  CBC WITH DIFFERENTIAL/PLATELET  BASIC METABOLIC PANEL  URINALYSIS, ROUTINE W REFLEX MICROSCOPIC  LIPASE, BLOOD  I-STAT BETA HCG BLOOD, ED ([facility], [facility], [facility] ONLY)    EKG None  Radiology No results found.  Procedures Procedures   Medications Ordered in ED Medications  sodium chloride 0.9 % bolus 1,000 mL (has no administration in time range)  metoCLOPramide (REGLAN) injection 10 mg (has no administration in time range)  diphenhydrAMINE (BENADRYL) injection 25 mg (has no administration in time range)  LORazepam (ATIVAN) injection 1 mg (has no administration in time range)    ED Course  I have reviewed the triage vital signs and the nursing notes.  Pertinent labs & imaging results that were available during my care of the patient were reviewed by me and considered in my medical decision making (see chart for details).    MDM Rules/Calculators/A&P                           Patient here with persistent nausea and vomiting over  the past 2 days.  She thinks that this is brought on by her narcolepsy.    Her vital signs are reassuring.  She is nontoxic in appearance.  She has no focal abdominal tenderness.  Laboratory work-up is notable for negative pregnancy test.  Mild leukocytosis, likely stress-induced.  Doubt infectious ideology.  Patient is afebrile.  Electrolytes reassuring.  No longer vomiting  in the ED.  Will discharge to home with antiemetic.  Return precautions discussed. Final Clinical Impression(s) / ED Diagnoses Final diagnoses:  Nausea and vomiting, intractability of vomiting not specified, unspecified vomiting type    Rx / DC Orders ED Discharge Orders     None        Roxy Horseman, PA-C 07/12/21 0528    Molpus, Jonny Ruiz, MD 07/12/21 [phone]

## 2021-07-12 NOTE — ED Triage Notes (Signed)
Pt to ED via [facility] for nausea/vomiting.  Vomited over 30 times since yesterday.  Pt believes symptoms are caused by dehydration and lack of sleep from her newly diagnosed with narcolepsy.

## 2021-07-12 NOTE — ED Notes (Signed)
Call received from pt sister Chalmers Cater [phone] requesting rtn call from pt when possible. [employer]
